# Patient Record
Sex: Female | Born: 1960 | Race: White | Hispanic: No | Marital: Single | Smoking: Current every day smoker
Health system: Southern US, Community
[De-identification: ages and names within clinical notes are randomized; demographics above are authoritative.]

## PROBLEM LIST (undated history)

## (undated) DIAGNOSIS — J449 Chronic obstructive pulmonary disease, unspecified: Secondary | ICD-10-CM

## (undated) HISTORY — PX: ECTOPIC PREGNANCY SURGERY: SHX613

## (undated) HISTORY — PX: TUBAL LIGATION: SHX77

---

## 1998-08-02 ENCOUNTER — Inpatient Hospital Stay (HOSPITAL_COMMUNITY): Admission: AD | Admit: 1998-08-02 | Discharge: 1998-08-04 | Payer: Self-pay | Admitting: Obstetrics & Gynecology

## 1999-03-02 ENCOUNTER — Emergency Department (HOSPITAL_COMMUNITY): Admission: EM | Admit: 1999-03-02 | Discharge: 1999-03-02 | Payer: Self-pay | Admitting: Emergency Medicine

## 1999-11-22 ENCOUNTER — Emergency Department (HOSPITAL_COMMUNITY): Admission: EM | Admit: 1999-11-22 | Discharge: 1999-11-22 | Payer: Self-pay | Admitting: Emergency Medicine

## 1999-12-01 ENCOUNTER — Emergency Department (HOSPITAL_COMMUNITY): Admission: EM | Admit: 1999-12-01 | Discharge: 1999-12-01 | Payer: Self-pay | Admitting: Emergency Medicine

## 1999-12-11 ENCOUNTER — Emergency Department (HOSPITAL_COMMUNITY): Admission: EM | Admit: 1999-12-11 | Discharge: 1999-12-11 | Payer: Self-pay | Admitting: Emergency Medicine

## 1999-12-13 ENCOUNTER — Encounter: Payer: Self-pay | Admitting: Emergency Medicine

## 1999-12-13 ENCOUNTER — Emergency Department (HOSPITAL_COMMUNITY): Admission: EM | Admit: 1999-12-13 | Discharge: 1999-12-13 | Payer: Self-pay | Admitting: *Deleted

## 2000-04-02 ENCOUNTER — Emergency Department (HOSPITAL_COMMUNITY): Admission: EM | Admit: 2000-04-02 | Discharge: 2000-04-02 | Payer: Self-pay | Admitting: Emergency Medicine

## 2000-04-09 ENCOUNTER — Ambulatory Visit (HOSPITAL_COMMUNITY): Admission: RE | Admit: 2000-04-09 | Discharge: 2000-04-09 | Payer: Self-pay | Admitting: Orthopedic Surgery

## 2000-04-09 ENCOUNTER — Encounter: Payer: Self-pay | Admitting: Orthopedic Surgery

## 2000-04-30 ENCOUNTER — Emergency Department (HOSPITAL_COMMUNITY): Admission: EM | Admit: 2000-04-30 | Discharge: 2000-04-30 | Payer: Self-pay | Admitting: *Deleted

## 2000-09-23 ENCOUNTER — Emergency Department (HOSPITAL_COMMUNITY): Admission: EM | Admit: 2000-09-23 | Discharge: 2000-09-23 | Payer: Self-pay | Admitting: Emergency Medicine

## 2000-10-28 ENCOUNTER — Encounter: Payer: Self-pay | Admitting: Emergency Medicine

## 2000-10-28 ENCOUNTER — Emergency Department (HOSPITAL_COMMUNITY): Admission: EM | Admit: 2000-10-28 | Discharge: 2000-10-28 | Payer: Self-pay | Admitting: Emergency Medicine

## 2000-12-26 ENCOUNTER — Emergency Department (HOSPITAL_COMMUNITY): Admission: EM | Admit: 2000-12-26 | Discharge: 2000-12-26 | Payer: Self-pay | Admitting: Emergency Medicine

## 2000-12-26 ENCOUNTER — Encounter: Payer: Self-pay | Admitting: Emergency Medicine

## 2002-03-10 ENCOUNTER — Inpatient Hospital Stay (HOSPITAL_COMMUNITY): Admission: AD | Admit: 2002-03-10 | Discharge: 2002-03-10 | Payer: Self-pay | Admitting: Obstetrics and Gynecology

## 2002-07-01 ENCOUNTER — Ambulatory Visit (HOSPITAL_BASED_OUTPATIENT_CLINIC_OR_DEPARTMENT_OTHER): Admission: RE | Admit: 2002-07-01 | Discharge: 2002-07-02 | Payer: Self-pay | Admitting: Orthopedic Surgery

## 2002-07-21 ENCOUNTER — Encounter: Admission: RE | Admit: 2002-07-21 | Discharge: 2002-08-27 | Payer: Self-pay | Admitting: Orthopedic Surgery

## 2007-04-15 ENCOUNTER — Emergency Department (HOSPITAL_COMMUNITY): Admission: EM | Admit: 2007-04-15 | Discharge: 2007-04-15 | Payer: Self-pay | Admitting: Emergency Medicine

## 2007-04-16 ENCOUNTER — Emergency Department (HOSPITAL_COMMUNITY): Admission: EM | Admit: 2007-04-16 | Discharge: 2007-04-16 | Payer: Self-pay | Admitting: Emergency Medicine

## 2009-06-18 ENCOUNTER — Encounter: Payer: Self-pay | Admitting: Emergency Medicine

## 2009-06-21 ENCOUNTER — Ambulatory Visit: Payer: Self-pay | Admitting: Psychiatry

## 2009-06-21 ENCOUNTER — Inpatient Hospital Stay (HOSPITAL_COMMUNITY): Admission: AD | Admit: 2009-06-21 | Discharge: 2009-06-23 | Payer: Self-pay | Admitting: Psychiatry

## 2009-12-25 ENCOUNTER — Emergency Department (HOSPITAL_COMMUNITY): Admission: EM | Admit: 2009-12-25 | Discharge: 2009-12-26 | Payer: Self-pay | Admitting: Emergency Medicine

## 2011-02-03 LAB — URINALYSIS, ROUTINE W REFLEX MICROSCOPIC
Bilirubin Urine: NEGATIVE
Glucose, UA: NEGATIVE mg/dL
Ketones, ur: NEGATIVE mg/dL
Leukocytes, UA: NEGATIVE
Nitrite: POSITIVE — AB
Protein, ur: 30 mg/dL — AB
Specific Gravity, Urine: 1.013 (ref 1.005–1.030)
Urobilinogen, UA: 1 mg/dL (ref 0.0–1.0)
pH: 5.5 (ref 5.0–8.0)

## 2011-02-03 LAB — DIFFERENTIAL
Basophils Relative: 0 % (ref 0–1)
Eosinophils Absolute: 0 10*3/uL (ref 0.0–0.7)
Lymphs Abs: 1 10*3/uL (ref 0.7–4.0)
Monocytes Absolute: 0.8 10*3/uL (ref 0.1–1.0)
Monocytes Relative: 13 % — ABNORMAL HIGH (ref 3–12)

## 2011-02-03 LAB — BASIC METABOLIC PANEL
CO2: 24 mEq/L (ref 19–32)
Chloride: 88 mEq/L — ABNORMAL LOW (ref 96–112)
GFR calc Af Amer: >60 mL/min (ref >60)
Potassium: 3.9 mEq/L (ref 3.5–5.1)
Sodium: 120 mEq/L — ABNORMAL LOW (ref 135–145)

## 2011-02-03 LAB — URINE MICROSCOPIC-ADD ON

## 2011-02-03 LAB — RAPID URINE DRUG SCREEN, HOSP PERFORMED
Barbiturates: NOT DETECTED
Benzodiazepines: NOT DETECTED
Cocaine: POSITIVE — AB
Opiates: NOT DETECTED

## 2011-02-03 LAB — POCT I-STAT, CHEM 8
Calcium, Ion: 1.06 mmol/L — ABNORMAL LOW (ref 1.12–1.32)
Creatinine, Ser: 0.7 mg/dL (ref 0.4–1.2)
Glucose, Bld: 89 mg/dL (ref 70–99)
HCT: 40 % (ref 36.0–46.0)
Hemoglobin: 13.6 g/dL (ref 12.0–15.0)

## 2011-02-03 LAB — CBC
HCT: 40.1 % (ref 36.0–46.0)
Hemoglobin: 14 g/dL (ref 12.0–15.0)
MCHC: 34.9 g/dL (ref 30.0–36.0)
MCV: 97.5 fL (ref 78.0–100.0)
RBC: 4.11 MIL/uL (ref 3.87–5.11)

## 2011-02-03 LAB — GLUCOSE, CAPILLARY: Glucose-Capillary: 120 mg/dL — ABNORMAL HIGH (ref 70–99)

## 2011-02-19 LAB — HEPATIC FUNCTION PANEL
ALT: 389 U/L — ABNORMAL HIGH (ref 0–35)
Bilirubin, Direct: 0.2 mg/dL (ref 0.0–0.3)
Indirect Bilirubin: 0.2 mg/dL — ABNORMAL LOW (ref 0.3–0.9)
Total Bilirubin: 0.4 mg/dL (ref 0.3–1.2)

## 2011-02-19 LAB — URINE MICROSCOPIC-ADD ON

## 2011-02-19 LAB — URINALYSIS, ROUTINE W REFLEX MICROSCOPIC
Glucose, UA: NEGATIVE mg/dL
Protein, ur: 30 mg/dL — AB
Specific Gravity, Urine: 1.005 (ref 1.005–1.030)
pH: 6.5 (ref 5.0–8.0)

## 2011-02-19 LAB — ETHANOL: Alcohol, Ethyl (B): <5 mg/dL (ref 0–10)

## 2011-02-19 LAB — COMPREHENSIVE METABOLIC PANEL
Albumin: 4.3 g/dL (ref 3.5–5.2)
Alkaline Phosphatase: 85 U/L (ref 39–117)
BUN: 6 mg/dL (ref 6–23)
Calcium: 9 mg/dL (ref 8.4–10.5)
Creatinine, Ser: 0.8 mg/dL (ref 0.4–1.2)
Glucose, Bld: 104 mg/dL — ABNORMAL HIGH (ref 70–99)
Potassium: 2.9 mEq/L — ABNORMAL LOW (ref 3.5–5.1)
Total Protein: 8.1 g/dL (ref 6.0–8.3)

## 2011-02-19 LAB — URINE CULTURE

## 2011-02-19 LAB — DIFFERENTIAL
Basophils Relative: 1 % (ref 0–1)
Lymphocytes Relative: 19 % (ref 12–46)
Lymphs Abs: 0.9 10*3/uL (ref 0.7–4.0)
Monocytes Absolute: 0.5 10*3/uL (ref 0.1–1.0)
Monocytes Relative: 10 % (ref 3–12)
Neutro Abs: 3.4 10*3/uL (ref 1.7–7.7)
Neutrophils Relative %: 69 % (ref 43–77)

## 2011-02-19 LAB — CBC
HCT: 39.8 % (ref 36.0–46.0)
Hemoglobin: 13.4 g/dL (ref 12.0–15.0)
MCHC: 33.6 g/dL (ref 30.0–36.0)
Platelets: 162 10*3/uL (ref 150–400)
RDW: 16.7 % — ABNORMAL HIGH (ref 11.5–15.5)

## 2011-02-19 LAB — RAPID URINE DRUG SCREEN, HOSP PERFORMED
Amphetamines: NOT DETECTED
Barbiturates: NOT DETECTED
Cocaine: NOT DETECTED
Opiates: NOT DETECTED

## 2011-02-19 LAB — ACETAMINOPHEN LEVEL: Acetaminophen (Tylenol), Serum: <10 ug/mL — ABNORMAL LOW (ref 10–30)

## 2011-03-28 NOTE — Discharge Summary (Signed)
NAME:  HLEE, FRINGER NO.:  [id number]   MEDICAL RECORD NO.:  [id number]          PATIENT TYPE:  IPS   LOCATION:  0406                          FACILITY:  [facility]   PHYSICIAN:  Jasmine Pang, M.D. DATE OF BIRTH:  09/17/1961   DATE OF ADMISSION:  06/21/2009  DATE OF DISCHARGE:  06/23/2009                               DISCHARGE SUMMARY   IDENTIFICATION:  This is a 50 year old female who was admitted on a  voluntary basis on June 21, 2009.   HISTORY OF PRESENT ILLNESS:  This is the first [facility] admission for this  pleasant 50 year old who presents with 2 weeks of visual hallucinations.  She states she has been seeing her relatives standing in front of her  and people walking around in the room and home.  At one point, she  believes the baby strolls over, actually speaking out loud to her.  She  reports that she had been drinking excessively with a 12-pack of beer or  more per day and was drinking that way for at least 6 months after  separating from her boyfriend of 9 years.  Subsequently, the patient  lost her apartment, her belongings, and all of her furniture.  She has  had hallucinations recently on June 20, 2009 prior to admission and was  given an injection of 20 mg of Geodon in the emergency room.  There were  no hallucinations today.  She was diagnosed with an urinary tract  infection in the emergency room.  She denies other substance abuse.  She  had no current psychiatric treatment.  She has a distant history of  opiate abuse and was previously treated with methadone.  At one point,  she has now been abstinent from opiates for more than a year.  She  denies any other substance abuse other than alcohol.  She denies a  history of prior suicide attempts.  For further admission information,  see psychiatric admission assessment.   Initially an Axis I diagnosis of psychosis disorder NOS was given, also  alcohol abuse and dependence, also a history of  polysubstance abuse.  The Axis III diagnoses were UTI, COPD and hypokalemia, recently  repleted.   PHYSICAL FINDINGS:  There were no acute physical or medical problems  noted.  Physical exam was done in the emergency room and is in the  record and was reviewed by our nurse practitioner.   DIAGNOSTIC STUDIES:  Urine drug screen was negative for all substances.  Alcohol level was less than 5.  Chemistries revealed a sodium of 135,  potassium of 2.9, chloride of 101, carbon dioxide of 21, BUN is 6,  creatinine of 0.8, and random glucose 104.  Liver enzymes revealed an  SGOT of 207, SGPT of 101, alkaline phosphatase of 85, and total  bilirubin of 2.2.  Her potassium was repleted in the emergency room.  Routine urinalysis was positive for large leukocyte esterase and WBCs of  21-58 per high-powered field.   HOSPITAL COURSE:  Upon admission, the patient was started on Ambien 10  mg p.o. q.h.s. p.r.n. insomnia.  She was also started on  Cipro 500 mg  p.o. b.i.d. x2 days only since she received this in the emergency  department.  She was also started on Librium 25 mg p.o. q.4 h. p.r.n.  withdrawal and this was subsequently discontinued the following day and  she was started on the Librium detox protocol.  She was also started on  Protonix 40 mg daily, Combivent MDI 2 puffs b.i.d. p.r.n. asthma, and  folic acid 1 mg p.o. daily.  In individual sessions, the patient was  initially somewhat disheveled with psychomotor retardation.  Speech was  soft and slow.  Mood was depressed and anxious.  Affect was consistent  with mood.  There were no suicidal or homicidal ideations.  Thought  processes were logical and goal directed.  There was no evidence of  psychosis.  The patient admitted that she drinks to much.  She drinks a  12-pack per day.  She also states she was hallucinating.  She has a lot  of stressors including a breakup and lost apartment and lost furniture.  These hallucinations appeared to  apparently occur when she was trying to  detox.  The patient stabilized very quickly.  She tolerated the detox  protocol well.  There were no side effects on her medications.  On  June 23, 2009, mental status had improved markedly from admission  status.  The patient was casually and neatly dressed.  Speech was normal  rate and flow.  Psychomotor activity was within normal limits.  The mood  was less depressed, less anxious.  Affect was consistent with mood.  There was no suicidal or homicidal ideation.  No thoughts of self-  injurious behavior.  No auditory or visual hallucinations.  No paranoia  or delusions.  Thoughts were logical and goal directed.  Thought  content, no predominant theme.  Cognitive was grossly intact.  Insight  good.  Judgment good.  Impulse control good.  The patient wanted to go  home and was felt to be safe for discharge today.   DISCHARGE DIAGNOSES:  Axis I:  Psychotic disorder, not otherwise  specified (rule out features of delirium from alcohol withdrawal);  alcohol dependence and history of polysubstance abuse.  Axis II:  None.  Axis III:  Urinary tract infection and chronic obstructive pulmonary  disease.  Axis IV:  Severe (issues with homelessness, breakup of relationship,  loss of furniture, burden of psychiatric and chemical dependence  illness, burden of medical problems).  Axis V:  Global assessment of functioning was 50 on discharge.  GAF was  46 upon admission.  GAF highest past year was 65.   DISCHARGE PLANS:  There were no specific activity level or dietary  restrictions.   POSTHOSPITAL CARE PLANS:  The patient will go to [employer] in  [place].  She is to get a sponsor.  She did not want any therapy at  this time.   DISCHARGE MEDICATIONS:  Cipro was finished.  She is to seek her doctor  if her bladder infection continues or other symptoms develop.  Of note,  she was given a pneumonia vaccine IM on June 22, 2009.      Jasmine Pang, M.D.  Electronically Signed     BHS/MEDQ  D:  06/29/2009  T:  06/30/2009  Job:  [id number]

## 2011-03-28 NOTE — H&P (Signed)
NAMENAEEMA, PATLAN NO.:  [id number]   MEDICAL RECORD NO.:  [id number]          PATIENT TYPE:  IPS   LOCATION:  0406                          [facility]ACILITY[facility]  [facility]   PHYSICIAN:  Anselm Jungling, MD  DATE OF BIRTH:  1961/01/25   DATE OF ADMISSION:  06/21/2009  DATE OF DISCHARGE:                       PSYCHIATRIC ADMISSION ASSESSMENT   IDENTIFYING INFORMATION:  A 50 year old female.  This is a voluntary  admission.   HISTORY OF THE PRESENT ILLNESS:  First [facility] admission for this pleasant  50 year old who presents with 2 weeks of visual hallucinations, had been  seeing her relatives standing in front of her, people walking around in  the room at home.  At one point, believed that a baby stroller was  actually speaking out loud to her then had almost an entire day where  she thought she was attending a party when in fact it was all a  hallucination.  She reports that she has been drinking excessively,  about a 12-pack of beer or more per day and has been drinking that way  for at least 6 months after separating from her boyfriend of 9 years,  subsequently losing her apartment, her belongings, and all of her  furniture.  She has had hallucinations as recently as June 20, 2009,  prior to admission and was given an injection of 20 mg of Geodon.  No  hallucinations today.  Was diagnosed with a urinary tract infection in  the emergency room.  She denies other substance abuse.   PAST PSYCHIATRIC HISTORY:  No current treatment.  She has a distant  history of opiate abuse and was previously treated with methadone at one  point.  Has now been abstinent from opiates for more than a year.  Denies any other substance abuse other than alcohol.  She denies a  history of prior suicide attempts.   SOCIAL HISTORY:  Single female currently living with her brother here in  [place], plans to return there.  Denies legal problems.   FAMILY HISTORY:  Brother and mother both with  significant alcohol  issues.  Multiple aunts and uncles with history of alcohol and other  substance abuse.   MEDICAL HISTORY:  No regular primary care [facility] J[facility].   MEDICAL PROBLEMS:  1. COPD.  2. Diagnosed with UTI in the emergency room.   PAST MEDICAL HISTORY:   CURRENT MEDICATIONS:  None.   PREVIOUS MEDICATIONS:  1. Spiriva.  2. Combivent.  3. Prilosec for GERD.   DRUG ALLERGIES:  NONE.   PHYSICAL EXAM:  Was done in the emergency room as noted in the record.  This is a slim-built 50 year old female, fully oriented today.  Appears  in no distress.  VITAL SIGNS:  Temp 98.4.  Pulse 91.  Respirations 24.  Blood pressure  189/110 on her initial presentation in the emergency room on June 18, 2009.   DIAGNOSTIC STUDIES:  Were done in the emergency room.  Her urine drug  screen was negative for all substances.  Alcohol level less than 5.  Chemistry, sodium 135, potassium 2.9, chloride 101, carbon dioxide 21,  BUN 6, creatinine  0.8, and random glucose 104.  Liver enzymes, SGOT 207,  SGPT 101, alkaline phosphatase 85, and total bilirubin 2.2.  her  potassium was repleted in the emergency room.  Routine urinalysis  revealed positive for large leukocyte esterase and WBCs of 21 to 50 per  high-powered field.   MENTAL STATUS EXAM:  Fully alert female, completely oriented today,  pleasant, good eye contact.  Has been in contact with family.  Interacting appropriately on the unit.  Out in the day room.  Is  attending groups.  Speech is normal.  Gives a coherent history.  Requesting detox from alcohol and ongoing treatment.  Mood is neutral  today.  Thought process logical and coherent.  No evidence of any  delirium or confusion.  She is denying any further hallucinations since  yesterday.  Does feel that she is slightly shaky inside, nauseous, feels  that she is continuing to have some withdrawal symptoms.  Asking for  help with referrals with ongoing treatment.  Cognitively, memory  is  intact.  Insight is good.  Impulse control and judgment within normal  limits.   AXIS I:  1. Psychosis NOS.  2. Alcohol abuse and dependence.  3. History of polysubstance abuse.  AXIS II:  Deferred.  AXIS III:  1. UTI.  2. COPD.  3. Hypokalemia, repleted.  AXIS IV:  Severe issues with homelessness.  AXIS V:  Current 46, past year not known.   PLAN:  Admit her to our dual diagnosis unit.  She has been cooperative  with staff.  No further symptoms of psychosis.  We are going to restart  her Combivent inhalers.  We have started her on a Librium protocol to  start on day 2 today.  She will continue to receive p.r.n. medications.  Will receive IM thiamine and p.r.n. medications to control symptoms.  Also, has had some problems with GERD and we will restart her Protonix  at 40 mg daily.  Additionally, she incidentally has some poison ivy  dermatitis on her chest for which we will prescribe 1% hydrocortisone  cream.  She received a dose of IM Rocephin in the emergency room for her  UTI and she is on Cipro 500 mg b.i.d. for a total of 3 days which we  will finish tomorrow, June 23, 2009.  We will check a TSH, a B12  level, RBC, folate.  She is receiving 100 mg of thiamine daily and folic  acid 1 mg daily.     Margaret A. Lorin Picket, N.P.      Anselm Jungling, MD  Electronically Signed   MAS/MEDQ  D:  06/22/2009  T:  06/22/2009  Job:  [id number]

## 2011-03-31 NOTE — Op Note (Signed)
NAME:  Candice Cooper, Candice Cooper NO.:  [id number]   MEDICAL RECORD NO.:  [id number]                   PATIENT TYPE:  AMB   LOCATION:  DSC                                  FACILITY:  [facility]   PHYSICIAN:  Burnard Bunting, M.D.                 DATE OF BIRTH:  11/18/1960   DATE OF PROCEDURE:  07/01/2002  DATE OF DISCHARGE:  07/02/2002                                 OPERATIVE REPORT   PREOPERATIVE DIAGNOSIS:  Left knee anterior cruciate ligament tear and  medial meniscal tear.   POSTOPERATIVE DIAGNOSES:  Left knee anterior cruciate ligament tear and  medial and lateral meniscal tears.   PROCEDURE:  1. Left knee anterior cruciate ligament reconstruction with allograft.  2. Partial medial and lateral meniscectomies.  3. Removal of loose bodies.   SURGEON:  Burnard Bunting, M.D.   ANESTHESIA:  General endotracheal.   ESTIMATED BLOOD LOSS:  10 cc.   DRAINS:  None.   TOURNIQUET TIME:  1 hour 37 minutes at 300 mmHg.   OPERATIVE FINDINGS:  1. Examination under anesthesia:  Range of motion 0-130.  She had stability     to varus and valgus stress at 0 and 30 degrees.  No posterolateral     rotatory instability.  PCL intact.  She had 5 mm of anterior drawer with     soft end point and positive pivot shift.  2. Diagnostic and operative arthroscopy:  (1) Intact patellofemoral     compartment.  (2) No loose bodies in medial or lateral gutter.  (3) There     was a loose body within the intercondylar notch region in accordance with     the MRI scan.  (4) Torn ACL, intact PCL.  (5) Torn medial meniscus with     free flap rotated in front of the joint and grade 1 chondromalacic     changes on the medial femoral condyle and tibial plateau.  (6) Torn     lateral meniscus involving 50% of the anterior-posterior width, slightly     lateral to the popliteus tendon.  There were also grade 1 chondral     changes on the tibial plateau in the lateral compartment.   DESCRIPTION OF  PROCEDURE:  The patient was brought to the operating room,  where general endotracheal anesthesia was induced.  Preoperative IV  antibiotics were administered.  The left leg was prepped with Duraprep  solution and draped in a sterile manner.  Collier Flowers was used to cover the  operative field.  On the back table the allograft was prepared.  Using the  oscillating saw, 10 mm bone plugs were fashioned and shaped into a bullet  for easy passage.  Ethibond #5 was passed through the nose of what would be  the femoral bone plug.  Two Fibrewires were placed through what would be the  tibial bone plug.  After preparing the allograft,  the leg was elevated and  exsanguinated with the Esmarch wrap and the tourniquet was inflated.  The  anterior inferior lateral portal was first established after localization  with the spinal needle.  Anterior inferior medial portal was then  established under direct visualization with a spinal needle.  Diagnostic  arthroscopy was performed.  The patellofemoral component was intact.  The  lateral compartment had a meniscal tear as well as grade 1 chondral changes  on the medial femoral condyle tibial plateau.  The meniscal tear was  resected back to a stable rim using a combination of basket punch and  shavers.  The loose body was removed from the intercondylar notch.  The ACL  stump was debrided and the over-the-top position was identified in the  posterior aspect of the lateral femoral condyle.  The lateral component was  inspected and the meniscal tear was noted here as well, which extended  lateral to the popliteus tendon.  This was also resected back to a stable  rim using basket punch and shavers.  At this time the ACL stump was shaved.  An ACL guide was set at 50 degrees.  A guide pin was placed through the  central to posterior aspect of the native ACL stump, right through the  middle artery.  A 10 mm tunnel was then reamed with an acorn reamer.  The 2  mm offset  guide was then placed into the back of the knee.  The 10 mm acorn  reamer was then used to drill a 27 mm tunnel.  Beath pin was placed, and the  graft was then delivered into the joint.  A nice horizontal appearance of  the graft was noted.  The graft was then secured into the femoral side with  an 8 x 23 mm interference screw.  Anesthesia check was positive for secure  fixation.  The graft was taken through a range of motion.  Isometry was  found to be very good.  There was no graft impingement in the intercondylar  notch region.  Notchplasty had been performed prior to graft passage.  The  graft was then secured on the tibial side in 15 degrees of flexion, in  maximum pull using another 8 x 23 mm interference screw.  The knee was taken  through a range of motion, was found to have about a 1.5 mm locking with  firm end point, negative pivot shift.  At this time the tourniquet was  released, the knee was irrigated.  Portals were closed using a 2-0 Vicryl  and 3-0 Prolene.  The small incision for the tibial drill guide was then  closed using interrupted 2-0 Vicryl suture followed by 3-0 Prolene suture.  Fifteen to twenty cubic centimeters of solution of Marcaine and morphine was  placed into the knee.  The knee was then wrapped in a bulky dressing.  The  tourniquet was released after 1 hour 37 minutes.  The knee immobilizer was  placed.  The foot was perfused after tourniquet released.  The patient  tolerated the procedure well without immediate complications.                                               Burnard Bunting, M.D.    GSD/MEDQ  D:  07/02/2002  T:  07/03/2002  Job:  [id number]

## 2011-08-31 ENCOUNTER — Institutional Professional Consult (permissible substitution): Payer: Self-pay | Admitting: Internal Medicine

## 2011-08-31 LAB — CBC
HCT: 31.6 — ABNORMAL LOW
Hemoglobin: 10.1 — ABNORMAL LOW
MCV: 74.4 — ABNORMAL LOW
RBC: 4.24
WBC: 4.2

## 2011-08-31 LAB — URINALYSIS, ROUTINE W REFLEX MICROSCOPIC
Bilirubin Urine: NEGATIVE
Glucose, UA: NEGATIVE
Hgb urine dipstick: NEGATIVE
Protein, ur: NEGATIVE
Specific Gravity, Urine: 1.006

## 2011-08-31 LAB — I-STAT 8, (EC8 V) (CONVERTED LAB)
Acid-base deficit: 1
Bicarbonate: 25.3 — ABNORMAL HIGH
Hemoglobin: 12.2
Potassium: 4
TCO2: 27

## 2011-08-31 LAB — URINE MICROSCOPIC-ADD ON

## 2011-08-31 LAB — RAPID URINE DRUG SCREEN, HOSP PERFORMED
Barbiturates: NOT DETECTED
Opiates: POSITIVE — AB

## 2016-03-22 ENCOUNTER — Encounter (HOSPITAL_COMMUNITY): Payer: Self-pay | Admitting: Emergency Medicine

## 2016-03-22 ENCOUNTER — Emergency Department (HOSPITAL_COMMUNITY)
Admission: EM | Admit: 2016-03-22 | Discharge: 2016-03-22 | Disposition: A | Discharge status: Home / Self Care | Payer: Medicaid Other | Attending: Emergency Medicine | Admitting: Emergency Medicine

## 2016-03-22 DIAGNOSIS — F17200 Nicotine dependence, unspecified, uncomplicated: Secondary | ICD-10-CM | POA: Diagnosis not present

## 2016-03-22 DIAGNOSIS — R1030 Lower abdominal pain, unspecified: Secondary | ICD-10-CM | POA: Diagnosis present

## 2016-03-22 DIAGNOSIS — Z791 Long term (current) use of non-steroidal anti-inflammatories (NSAID): Secondary | ICD-10-CM | POA: Insufficient documentation

## 2016-03-22 DIAGNOSIS — N390 Urinary tract infection, site not specified: Secondary | ICD-10-CM | POA: Diagnosis not present

## 2016-03-22 DIAGNOSIS — J449 Chronic obstructive pulmonary disease, unspecified: Secondary | ICD-10-CM | POA: Diagnosis not present

## 2016-03-22 HISTORY — DX: Chronic obstructive pulmonary disease, unspecified: J44.9

## 2016-03-22 LAB — URINE MICROSCOPIC-ADD ON

## 2016-03-22 LAB — URINALYSIS, ROUTINE W REFLEX MICROSCOPIC
Bilirubin Urine: NEGATIVE
Glucose, UA: NEGATIVE mg/dL
Ketones, ur: NEGATIVE mg/dL
Nitrite: NEGATIVE
Protein, ur: NEGATIVE mg/dL
SPECIFIC GRAVITY, URINE: 1.006 (ref 1.005–1.030)
pH: 6.5 (ref 5.0–8.0)

## 2016-03-22 LAB — CBC
HCT: 45.6 % (ref 36.0–46.0)
HEMOGLOBIN: 15.7 g/dL — AB (ref 12.0–15.0)
MCH: 33.9 pg (ref 26.0–34.0)
MCHC: 34.4 g/dL (ref 30.0–36.0)
MCV: 98.5 fL (ref 78.0–100.0)
Platelets: 207 10*3/uL (ref 150–400)
RBC: 4.63 MIL/uL (ref 3.87–5.11)
RDW: 14.2 % (ref 11.5–15.5)
WBC: 5.7 10*3/uL (ref 4.0–10.5)

## 2016-03-22 LAB — COMPREHENSIVE METABOLIC PANEL
ALK PHOS: 49 U/L (ref 38–126)
ALT: 19 U/L (ref 14–54)
ANION GAP: 12 (ref 5–15)
AST: 34 U/L (ref 15–41)
Albumin: 4.2 g/dL (ref 3.5–5.0)
BUN: <5 mg/dL — ABNORMAL LOW (ref 6–20)
CALCIUM: 9.1 mg/dL (ref 8.9–10.3)
CHLORIDE: 89 mmol/L — AB (ref 101–111)
CO2: 26 mmol/L (ref 22–32)
Creatinine, Ser: 0.7 mg/dL (ref 0.44–1.00)
GFR calc non Af Amer: >60 mL/min (ref >60)
Glucose, Bld: 92 mg/dL (ref 65–99)
Potassium: 3.7 mmol/L (ref 3.5–5.1)
SODIUM: 127 mmol/L — AB (ref 135–145)
Total Bilirubin: 0.9 mg/dL (ref 0.3–1.2)
Total Protein: 7.6 g/dL (ref 6.5–8.1)

## 2016-03-22 LAB — LIPASE, BLOOD: LIPASE: 48 U/L (ref 11–51)

## 2016-03-22 MED ORDER — CEPHALEXIN 250 MG PO CAPS: 250.0000 mg | ORAL_CAPSULE | Freq: Four times a day (QID) | ORAL | Status: DC

## 2016-03-22 MED ORDER — CEPHALEXIN 250 MG PO CAPS
250.0000 mg | ORAL_CAPSULE | Freq: Once | ORAL | Status: AC
  Administered 2016-03-22: 250 mg via ORAL
  Filled 2016-03-22: qty 1

## 2016-03-22 MED ORDER — HYDROCODONE-ACETAMINOPHEN 5-325 MG PO TABS
2.0000 | ORAL_TABLET | Freq: Once | ORAL | Status: AC
  Administered 2016-03-22: 2 via ORAL
  Filled 2016-03-22: qty 2

## 2016-03-22 NOTE — Discharge Instructions (Signed)
You have been diagnosed with a UTI.  Please take your medications as directed.  Please return to the ER for fever, worsening pain, or vomiting.  You sodium was slightly low today.  Please drink more water and have this rechecked by your primary care doctor in 1 week.  Urinary Tract Infection Urinary tract infections (UTIs) can develop anywhere along your urinary tract. Your urinary tract is your body's drainage system for removing wastes and extra water. Your urinary tract includes two kidneys, two ureters, a bladder, and a urethra. Your kidneys are a pair of bean-shaped organs. Each kidney is about the size of your fist. They are located below your ribs, one on each side of your spine. CAUSES Infections are caused by microbes, which are microscopic organisms, including fungi, viruses, and bacteria. These organisms are so small that they can only be seen through a microscope. Bacteria are the microbes that most commonly cause UTIs. SYMPTOMS  Symptoms of UTIs may vary by age and gender of the patient and by the location of the infection. Symptoms in young women typically include a frequent and intense urge to urinate and a painful, burning feeling in the bladder or urethra during urination. Older women and men are more likely to be tired, shaky, and weak and have muscle aches and abdominal pain. A fever may mean the infection is in your kidneys. Other symptoms of a kidney infection include pain in your back or sides below the ribs, nausea, and vomiting. DIAGNOSIS To diagnose a UTI, your caregiver will ask you about your symptoms. Your caregiver will also ask you to provide a urine sample. The urine sample will be tested for bacteria and white blood cells. White blood cells are made by your body to help fight infection. TREATMENT  Typically, UTIs can be treated with medication. Because most UTIs are caused by a bacterial infection, they usually can be treated with the use of antibiotics. The choice of  antibiotic and length of treatment depend on your symptoms and the type of bacteria causing your infection. HOME CARE INSTRUCTIONS  If you were prescribed antibiotics, take them exactly as your caregiver instructs you. Finish the medication even if you feel better after you have only taken some of the medication.  Drink enough water and fluids to keep your urine clear or pale yellow.  Avoid caffeine, tea, and carbonated beverages. They tend to irritate your bladder.  Empty your bladder often. Avoid holding urine for long periods of time.  Empty your bladder before and after sexual intercourse.  After a bowel movement, women should cleanse from front to back. Use each tissue only once. SEEK MEDICAL CARE IF:   You have back pain.  You develop a fever.  Your symptoms do not begin to resolve within 3 days. SEEK IMMEDIATE MEDICAL CARE IF:   You have severe back pain or lower abdominal pain.  You develop chills.  You have nausea or vomiting.  You have continued burning or discomfort with urination. MAKE SURE YOU:   Understand these instructions.  Will watch your condition.  Will get help right away if you are not doing well or get worse.   This information is not intended to replace advice given to you by your health care provider. Make sure you discuss any questions you have with your health care provider.   Document Released: 08/09/2005 Document Revised: 07/21/2015 Document Reviewed: 12/08/2011 Elsevier Interactive Patient Education [employer]2016 [employer]

## 2016-03-22 NOTE — ED Provider Notes (Signed)
CSN: [id number]     Arrival date & time 03/22/16  1856 History   First MD Initiated Contact with Patient 03/22/16 1957     Chief Complaint  Patient presents with  . Abdominal Pain     (Consider location/radiation/quality/duration/timing/severity/associated sxs/prior Treatment) HPI Comments: Patient presents to the ED with a chief complaint of lower abdominal pain, frequency, urgency, and dysuria.  She states that her symptoms feel like a UTI.  She states that she has been treating the symptoms with AZO and ibuprofen.  Onset was about a week ago.  She denies any associated fever, chills, nausea or vomiting.  She denies any other associated symptoms.  She states that she is allergic to something IV, but doesn't know what.  She states that she has done well with penicillin and amoxicillin in the past.  The history is provided by the patient. No language interpreter was used.    Past Medical History  Diagnosis Date  . COPD (chronic obstructive pulmonary disease) [facility])    Past Surgical History  Procedure Laterality Date  . Tubal ligation    . Ectopic pregnancy surgery    . Cesarean section     Family History  Problem Relation Age of Onset  . Diabetes Brother    Social History  Substance Use Topics  . Smoking status: Current Every Day Smoker  . Smokeless tobacco: None  . Alcohol Use: Yes   OB History    No data available     Review of Systems  Constitutional: Negative for fever and chills.  Respiratory: Negative for shortness of breath.   Cardiovascular: Negative for chest pain.  Gastrointestinal: Positive for abdominal pain. Negative for nausea, vomiting, diarrhea and constipation.  Genitourinary: Positive for dysuria, urgency and frequency.  All other systems reviewed and are negative.     Allergies  Review of patient's allergies indicates no known allergies.  Home Medications   Prior to Admission medications   Medication Sig Start Date End Date Taking? Authorizing  Provider  ibuprofen (ADVIL,MOTRIN) 200 MG tablet Take 400-600 mg by mouth every 4 (four) hours as needed.   Yes Historical Provider, MD  naproxen sodium (ANAPROX) 220 MG tablet Take 440 mg by mouth 2 (two) times daily as needed (pain).   Yes Historical Provider, MD   BP 125/87 mmHg  Pulse 81  Temp(Src) 97.9 F (36.6 C) (Oral)  Resp 16  Ht  (1.676 m)  Wt 54.432 kg  BMI 19.38 kg/m2  SpO2 98% Physical Exam  Constitutional: She is oriented to person, place, and time. She appears well-developed and well-nourished.  HENT:  Head: Normocephalic and atraumatic.  Eyes: Conjunctivae and EOM are normal. Pupils are equal, round, and reactive to light.  Neck: Normal range of motion. Neck supple.  Cardiovascular: Normal rate and regular rhythm.  Exam reveals no gallop and no friction rub.   No murmur heard. Pulmonary/Chest: Effort normal and breath sounds normal. No respiratory distress. She has no wheezes. She has no rales. She exhibits no tenderness.  Abdominal: Soft. Bowel sounds are normal. She exhibits no distension and no mass. There is no tenderness. There is no rebound and no guarding.  No focal abdominal tenderness, no RLQ tenderness or pain at McBurney's point, no RUQ tenderness or Murphy's sign, no left-sided abdominal tenderness, no fluid wave, or signs of peritonitis   Musculoskeletal: Normal range of motion. She exhibits no edema or tenderness.  Neurological: She is alert and oriented to person, place, and time.  Skin: Skin  is warm and dry.  Psychiatric: She has a normal mood and affect. Her behavior is normal. Judgment and thought content normal.  Nursing note and vitals reviewed.   ED Course  Procedures (including critical care time) Results for orders placed or performed during the hospital encounter of 03/22/16  Lipase, blood  Result Value Ref Range   Lipase 48 11 - 51 U/L  Comprehensive metabolic panel  Result Value Ref Range   Sodium 127 (L) 135 - 145 mmol/L    Potassium 3.7 3.5 - 5.1 mmol/L   Chloride 89 (L) 101 - 111 mmol/L   CO2 26 22 - 32 mmol/L   Glucose, Bld 92 65 - 99 mg/dL   BUN <5 (L) 6 - 20 mg/dL   Creatinine, Ser 3.240.70 0.44 - 1.00 mg/dL   Calcium 9.1 8.9 - 40.110.3 mg/dL   Total Protein 7.6 6.5 - 8.1 g/dL   Albumin 4.2 3.5 - 5.0 g/dL   AST 34 15 - 41 U/L   ALT 19 14 - 54 U/L   Alkaline Phosphatase 49 38 - 126 U/L   Total Bilirubin 0.9 0.3 - 1.2 mg/dL   GFR calc non Af Amer >60 >60 mL/min   GFR calc Af Amer >60 >60 mL/min   Anion gap 12 5 - 15  CBC  Result Value Ref Range   WBC 5.7 4.0 - 10.5 K/uL   RBC 4.63 3.87 - 5.11 MIL/uL   Hemoglobin 15.7 (H) 12.0 - 15.0 g/dL   HCT 02.745.6 25.336.0 - 66.446.0 %   MCV 98.5 78.0 - 100.0 fL   MCH 33.9 26.0 - 34.0 pg   MCHC 34.4 30.0 - 36.0 g/dL   RDW 40.314.2 47.411.5 - 25.915.5 %   Platelets 207 150 - 400 K/uL  Urinalysis, Routine w reflex microscopic  Result Value Ref Range   Color, Urine YELLOW YELLOW   APPearance CLOUDY (A) CLEAR   Specific Gravity, Urine 1.006 1.005 - 1.030   pH 6.5 5.0 - 8.0   Glucose, UA NEGATIVE NEGATIVE mg/dL   Hgb urine dipstick SMALL (A) NEGATIVE   Bilirubin Urine NEGATIVE NEGATIVE   Ketones, ur NEGATIVE NEGATIVE mg/dL   Protein, ur NEGATIVE NEGATIVE mg/dL   Nitrite NEGATIVE NEGATIVE   Leukocytes, UA MODERATE (A) NEGATIVE  Urine microscopic-add on  Result Value Ref Range   Squamous Epithelial / LPF 0-5 (A) NONE SEEN   WBC, UA TOO NUMEROUS TO COUNT 0 - 5 WBC/hpf   RBC / HPF 0-5 0 - 5 RBC/hpf   Bacteria, UA MANY (A) NONE SEEN   No results found.  I have personally reviewed and evaluated these lab results as part of my medical decision-making.   MDM   Final diagnoses:  UTI (lower urinary tract infection)    Patient with symptoms consistent with UTI. Abdomen is soft and non-tender.  Patient is non-toxic appearing. Will treat with keflex.  She has take penicillin and amoxicillin with no problems in the past.  Will observe for 30 minutes prior to discharge to ensure no  allergy.  Sodium slightly low and hgb is up, suspect dehydration.  Encouraged to drink more water.  Tolerating POs. Urine sent for culture.      Roxy HorsemanRobert Druanne Bosques, PA-C 03/22/16 2042  Bethann BerkshireJoseph Zammit, MD 03/23/16 [phone]

## 2016-03-22 NOTE — ED Notes (Signed)
Pt states she is having lower abd pain with a lot of pressure  Pt states she feels like she has to go to the restroom and cannot or she goes small amount at a time  Pt states she has been taking Azo but it is not helping  Pt states she thinks she has a UTI

## 2016-03-25 LAB — URINE CULTURE

## 2016-03-26 ENCOUNTER — Telehealth (HOSPITAL_BASED_OUTPATIENT_CLINIC_OR_DEPARTMENT_OTHER): Payer: Self-pay

## 2016-03-26 NOTE — Telephone Encounter (Signed)
Post ED Visit - Positive Culture Follow-up  Culture report reviewed by antimicrobial stewardship pharmacist:  [x]  Candice Cooper, Pharm.D. []  Candice Cooper, Pharm.D., BCPS []  Candice Cooper, Pharm.D. []  Candice Cooper, Pharm.D., BCPS []  Candice Cooper, [place]arm[place]D., BCPS, AAHIVP []  Candice Cooper, Pharm.D., BCPS, AAHIVP []  Candice Cooper, Pharm.D. []  Candice Cooper, [address]Pharm[address]  Positive urine culture Treated with Cephalexin, organism sensitive to the same and no further patient follow-up is required at this time.  Candice Cooper, Candice Cooper 03/26/2016, 1:01 PM

## 2020-09-13 ENCOUNTER — Emergency Department (HOSPITAL_COMMUNITY): Payer: Medicaid Other

## 2020-09-13 ENCOUNTER — Other Ambulatory Visit: Payer: Self-pay

## 2020-09-13 ENCOUNTER — Emergency Department (HOSPITAL_COMMUNITY)
Admission: EM | Admit: 2020-09-13 | Discharge: 2020-09-13 | Disposition: A | Discharge status: Home / Self Care | Payer: Medicaid Other | Attending: Emergency Medicine | Admitting: Emergency Medicine

## 2020-09-13 ENCOUNTER — Encounter (HOSPITAL_COMMUNITY)
Admission: EM | Disposition: A | Discharge status: Home / Self Care | Payer: Self-pay | Source: Home / Self Care | Attending: Emergency Medicine

## 2020-09-13 ENCOUNTER — Encounter (HOSPITAL_COMMUNITY): Payer: Self-pay

## 2020-09-13 DIAGNOSIS — J449 Chronic obstructive pulmonary disease, unspecified: Secondary | ICD-10-CM | POA: Insufficient documentation

## 2020-09-13 DIAGNOSIS — X58XXXA Exposure to other specified factors, initial encounter: Secondary | ICD-10-CM | POA: Insufficient documentation

## 2020-09-13 DIAGNOSIS — Z20822 Contact with and (suspected) exposure to covid-19: Secondary | ICD-10-CM | POA: Insufficient documentation

## 2020-09-13 DIAGNOSIS — K222 Esophageal obstruction: Secondary | ICD-10-CM

## 2020-09-13 DIAGNOSIS — T18128A Food in esophagus causing other injury, initial encounter: Secondary | ICD-10-CM

## 2020-09-13 DIAGNOSIS — F17200 Nicotine dependence, unspecified, uncomplicated: Secondary | ICD-10-CM | POA: Insufficient documentation

## 2020-09-13 DIAGNOSIS — W44F3XA Food entering into or through a natural orifice, initial encounter: Secondary | ICD-10-CM | POA: Insufficient documentation

## 2020-09-13 DIAGNOSIS — Z79899 Other long term (current) drug therapy: Secondary | ICD-10-CM | POA: Insufficient documentation

## 2020-09-13 HISTORY — PX: ESOPHAGOGASTRODUODENOSCOPY: SHX5428

## 2020-09-13 HISTORY — PX: FOREIGN BODY REMOVAL: SHX962

## 2020-09-13 LAB — RESP PANEL BY RT PCR (RSV, FLU A&B, COVID)
Influenza A by PCR: NEGATIVE
Influenza B by PCR: NEGATIVE
Respiratory Syncytial Virus by PCR: NEGATIVE
SARS Coronavirus 2 by RT PCR: NEGATIVE

## 2020-09-13 SURGERY — EGD (ESOPHAGOGASTRODUODENOSCOPY)
Anesthesia: Moderate Sedation

## 2020-09-13 MED ORDER — PROPOFOL 500 MG/50ML IV EMUL
INTRAVENOUS | Status: AC
  Filled 2020-09-13: qty 50

## 2020-09-13 MED ORDER — MIDAZOLAM HCL (PF) 10 MG/2ML IJ SOLN
INTRAMUSCULAR | Status: DC | PRN
  Administered 2020-09-13: 2 mg via INTRAVENOUS
  Administered 2020-09-13 (×4): 1 mg via INTRAVENOUS

## 2020-09-13 MED ORDER — KETAMINE HCL 10 MG/ML IJ SOLN
INTRAMUSCULAR | Status: AC | PRN
  Administered 2020-09-13: 50 mg via INTRAVENOUS

## 2020-09-13 MED ORDER — ONDANSETRON HCL 4 MG/2ML IJ SOLN
4.0000 mg | Freq: Once | INTRAMUSCULAR | Status: AC
  Administered 2020-09-13: 4 mg via INTRAVENOUS
  Filled 2020-09-13: qty 2

## 2020-09-13 MED ORDER — MORPHINE SULFATE (PF) 2 MG/ML IV SOLN
2.0000 mg | Freq: Once | INTRAVENOUS | Status: AC
  Administered 2020-09-13: 2 mg via INTRAVENOUS
  Filled 2020-09-13: qty 1

## 2020-09-13 MED ORDER — PROPOFOL 10 MG/ML IV BOLUS
INTRAVENOUS | Status: AC | PRN
  Administered 2020-09-13: 50 mg via INTRAVENOUS
  Administered 2020-09-13: 20 mg via INTRAVENOUS

## 2020-09-13 MED ORDER — GLUCAGON HCL RDNA (DIAGNOSTIC) 1 MG IJ SOLR
1.0000 mg | Freq: Once | INTRAMUSCULAR | Status: AC
  Administered 2020-09-13: 1 mg via INTRAVENOUS
  Filled 2020-09-13: qty 1

## 2020-09-13 MED ORDER — MAGIC MOUTHWASH W/LIDOCAINE: 5.0000 mL | Freq: Three times a day (TID) | ORAL | 0 refills | Status: AC

## 2020-09-13 MED ORDER — FENTANYL CITRATE (PF) 100 MCG/2ML IJ SOLN
INTRAMUSCULAR | Status: AC
  Filled 2020-09-13: qty 4

## 2020-09-13 MED ORDER — DIPHENHYDRAMINE HCL 50 MG/ML IJ SOLN
INTRAMUSCULAR | Status: DC | PRN
  Administered 2020-09-13: 25 mg via INTRAVENOUS

## 2020-09-13 MED ORDER — KETAMINE HCL 50 MG/5ML IJ SOSY: 50.0000 mg | PREFILLED_SYRINGE | Freq: Once | INTRAMUSCULAR | Status: DC

## 2020-09-13 MED ORDER — SODIUM CHLORIDE 0.9 % IV SOLN: INTRAVENOUS | Status: DC

## 2020-09-13 MED ORDER — MIDAZOLAM HCL (PF) 5 MG/ML IJ SOLN
INTRAMUSCULAR | Status: AC
  Filled 2020-09-13: qty 2

## 2020-09-13 MED ORDER — MAGIC MOUTHWASH W/LIDOCAINE: 5.0000 mL | Freq: Three times a day (TID) | ORAL | 0 refills | Status: DC

## 2020-09-13 MED ORDER — OMEPRAZOLE 20 MG PO CPDR: 20.0000 mg | DELAYED_RELEASE_CAPSULE | Freq: Every day | ORAL | 0 refills | Status: DC

## 2020-09-13 MED ORDER — ONDANSETRON HCL 4 MG PO TABS: 4.0000 mg | ORAL_TABLET | Freq: Four times a day (QID) | ORAL | 0 refills | Status: AC

## 2020-09-13 MED ORDER — PROPOFOL 10 MG/ML IV BOLUS: 200.0000 mg | Freq: Once | INTRAVENOUS | Status: DC

## 2020-09-13 MED ORDER — KETAMINE HCL 50 MG/5ML IJ SOSY
PREFILLED_SYRINGE | INTRAMUSCULAR | Status: AC
  Filled 2020-09-13: qty 5

## 2020-09-13 MED ORDER — DIPHENHYDRAMINE HCL 50 MG/ML IJ SOLN
INTRAMUSCULAR | Status: AC
  Filled 2020-09-13: qty 2

## 2020-09-13 MED ORDER — FENTANYL CITRATE (PF) 100 MCG/2ML IJ SOLN
INTRAMUSCULAR | Status: DC | PRN
  Administered 2020-09-13 (×3): 25 ug via INTRAVENOUS
  Administered 2020-09-13: 50 ug via INTRAVENOUS

## 2020-09-13 NOTE — ED Provider Notes (Addendum)
[facility]-EMERGENCY DEPT Provider Note   CSN: [id number] Arrival date & time: 09/13/20  1451     History Chief Complaint  Patient presents with   Choking    Candice Cooper is a 59 y.o. female.  HPI   Patient with no significant medical history presents to the emergency department with chief complaint of choking.  Patient states around 1:30 PM she was eating sausage and she felt like it got stuck in her throat.  Since then she has been unable to hold down food or water.  She continues to gag and is unable to get the sausage back up.  She states she has had this happen to her in the past but has never had to come to the hospital for this.  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  She denies shortness of breath, throat pain, nausea, vomiting, fevers or chills.  She endorses that she is unable to swallow her own saliva as it keeps coming back up.  She denies headache, fever, chills, shortness breath, chest pain, vomiting, nausea vomiting diarrhea.  Past Medical History:  Diagnosis Date   COPD (chronic obstructive pulmonary disease) (HCC)     Patient Active Problem List   Diagnosis Date Noted   Esophageal obstruction due to food impaction    Food impaction of esophagus     Past Surgical History:  Procedure Laterality Date   CESAREAN SECTION     ECTOPIC PREGNANCY SURGERY     TUBAL LIGATION       OB History   No obstetric history on file.     Family History  Problem Relation Age of Onset   Diabetes Brother     Social History   Tobacco Use   Smoking status: Current Every Day Smoker  Substance Use Topics   Alcohol use: Yes   Drug use: Yes    Types: Marijuana    Comment: last use today     Home Medications Prior to Admission medications   Medication Sig Start Date End Date Taking? Authorizing Provider  albuterol (PROVENTIL) (2.5  MG/3ML) 0.083% nebulizer solution Inhale into the lungs. 06/15/20  Yes [provider]  amLODipine (NORVASC) 5 MG tablet Take by mouth. 06/15/20  Yes [provider]  Fluticasone-Umeclidin-Vilant 100-62.5-25 MCG/INH AEPB Inhale into the lungs. 06/15/20 09/13/20 Yes [provider]  lisinopril (ZESTRIL) 5 MG tablet Take by mouth. 06/15/20  Yes [provider]  cephALEXin (KEFLEX) 250 MG capsule Take 1 capsule (250 mg total) by mouth 4 (four) times daily. 03/22/16   Roxy HorsemanBrowning, Robert, PA-C  ibuprofen (ADVIL,MOTRIN) 200 MG tablet Take 400-600 mg by mouth every 4 (four) hours as needed.    [provider]  magic mouthwash w/lidocaine SOLN Take 5 mLs by mouth 3 (three) times daily for 7 days. 09/13/20 09/20/20  Carroll SageFaulkner, Tyheem Boughner J, PA-C  naproxen sodium (ANAPROX) 220 MG tablet Take 440 mg by mouth 2 (two) times daily as needed (pain).    [provider]  omeprazole (PRILOSEC) 20 MG capsule Take 1 capsule (20 mg total) by mouth daily. 09/13/20 10/13/20  Carroll SageFaulkner, Jansel Vonstein J, PA-C  ondansetron (ZOFRAN) 4 MG tablet Take 1 tablet (4 mg total) by mouth every 6 (six) hours. 09/13/20   Carroll SageFaulkner, Aleksandr Pellow J, PA-C    Allergies    Patient has no known allergies.  Review of Systems   Review  of Systems  Constitutional: Negative for chills and fever.  HENT: Positive for sore throat and trouble swallowing. Negative for congestion, tinnitus and voice change.   Eyes: Negative for visual disturbance.  Respiratory: Negative for shortness of breath.   Cardiovascular: Negative for chest pain.  Gastrointestinal: Negative for abdominal pain, diarrhea, nausea and vomiting.  Genitourinary: Negative for enuresis.  Musculoskeletal: Negative for back pain.  Skin: Negative for rash.  Neurological: Negative for dizziness and headaches.  Hematological: Does not bruise/bleed easily.    Physical Exam Updated Vital Signs BP 133/74    Pulse (!) 102    Temp 98.3 F (36.8 C) (Oral)     Resp 10    Ht 5\' 8"  (1.727 m)    Wt 59 kg    SpO2 98%    BMI 19.77 kg/m   Physical Exam Vitals and nursing note reviewed.  Constitutional:      General: She is not in acute distress.    Appearance: She is not ill-appearing.  HENT:     Head: Normocephalic and atraumatic.     Nose: No congestion.     Mouth/Throat:     Mouth: Mucous membranes are moist.     Pharynx: Oropharynx is clear. No oropharyngeal exudate or posterior oropharyngeal erythema.     Comments: Oropharynx is visualized tongue and uvula are both midline, no food bolus noted. Eyes:     General: No scleral icterus. Cardiovascular:     Rate and Rhythm: Normal rate and regular rhythm.     Pulses: Normal pulses.     Heart sounds: No murmur heard.  No friction rub. No gallop.   Pulmonary:     Effort: No respiratory distress.     Breath sounds: No wheezing, rhonchi or rales.  Abdominal:     General: There is no distension.     Palpations: Abdomen is soft.     Tenderness: There is no abdominal tenderness. There is no right CVA tenderness, left CVA tenderness or guarding.  Musculoskeletal:        General: No swelling.  Skin:    General: Skin is warm and dry.     Findings: No rash.  Neurological:     Mental Status: She is alert.  Psychiatric:        Mood and Affect: Mood normal.     ED Results / Procedures / Treatments   Labs (all labs ordered are listed, but only abnormal results are displayed) Labs Reviewed  RESP PANEL BY RT PCR (RSV, FLU A&B, COVID)    EKG EKG Interpretation  Date/Time:  Monday September 13 2020 16:08:42 EDT Ventricular Rate:  94 PR Interval:    QRS Duration: 75 QT Interval:  343 QTC Calculation: 429 R Axis:   89 Text Interpretation: Sinus rhythm Probable anteroseptal infarct, old Nonspecific T abnormalities, lateral leads No significant change since last tracing Confirmed by 11-03-1981 [phone]) on 09/13/2020 4:12:02 PM   Radiology DG Chest 2 View  Result Date:  09/13/2020 CLINICAL DATA:  Choking episode at 1 p.m., cough, possible impacted food bolus EXAM: CHEST - 2 VIEW COMPARISON:  05/18/2020 FINDINGS: Frontal and lateral views of the chest demonstrate an unremarkable cardiac silhouette. No airspace disease, effusion, or pneumothorax. Lungs are mildly hyperinflated. No acute bony abnormalities. No radiopaque foreign body. IMPRESSION: 1. Mild hyperinflation. No acute airspace disease. No radiopaque foreign body. Electronically Signed   By: 07/19/2020 M.D.   On: 09/13/2020 16:50    Procedures Procedures (including critical care time)  Medications Ordered in ED Medications  propofol (DIPRIVAN) 10 mg/mL bolus/IV push 200 mg (50 mg Intravenous See Procedure Record 09/13/20 2015)  ketamine 50 mg in normal saline 5 mL (10 mg/mL) syringe (50 mg Intravenous See Procedure Record 09/13/20 2014)  glucagon (human recombinant) (GLUCAGEN) injection 1 mg (1 mg Intravenous Given 09/13/20 1621)  ondansetron (ZOFRAN) injection 4 mg (4 mg Intravenous Given 09/13/20 1619)  morphine 2 MG/ML injection 2 mg (2 mg Intravenous Given 09/13/20 1716)  ketamine (KETALAR) injection (50 mg Intravenous Given 09/13/20 2014)  propofol (DIPRIVAN) 10 mg/mL bolus/IV push (20 mg Intravenous Given 09/13/20 2018)  morphine 2 MG/ML injection 2 mg (2 mg Intravenous Given 09/13/20 2136)    ED Course  I have reviewed the triage vital signs and the nursing notes.  Pertinent labs & imaging results that were available during my care of the patient were reviewed by me and considered in my medical decision making (see chart for details).    MDM Rules/Calculators/A&P                          Patient presents with possible food bolus.  She was alert, did not appear in acute distress, vital signs significant for tachycardia.  Will provide her with antiemetics, IV glucagon, pain medication x-ray and reevaluate.  Patient's respiratory panel is negative for Covid, influenza A/B, RSV.  Chest x-ray does  not reveal any acute findings.  EKG was sinus tach without signs of ischemia no ST elevation depression noted.  Patient was evaluated after providing her with glucagon. She was given water and immediately vomited the water back up.  She is not tolerating p.o. will consult  GI for further recommendations.  Spoke with Dr. Rosalia Hammers of [facility] who feels patient has possible food impaction.  She states she will come and perform a EGD in the emergency department.  EGD was performed today under sedation and a large piece of sausage was removed from patient esophagus.  She tolerated the procedure well.  She is tolerating p.o. without difficulty. Dr. Rosalia Hammers recommends patient soft diet and start her on PPI with Magic mouthwash.  GI office will contact patient tomorrow to schedule a follow-up appointment.  I have low suspicion for foreign body ingestion as x-ray does not reveal any acute findings.  Low suspicion for foreign body stuck in patient's airway as lung sounds were clear bilaterally no wheezing or stridor noted on exam.  Low suspicion for retropharyngeal abscess, peritonsillar abscess, Ludwig angina as oropharynx was visualized no edema noted in the posterior pharynx, no exudates or erythema noted.  Low suspicion for systemic infection as patient nontoxic-appearing, vital signs reassuring.  I suspect tachycardia secondary to the pain in her throat.  Will provide patient with PPIs and Magic mouthwash, encourage soft food diet and have her follow-up with GI for further evaluation.  Vital signs have remained stable, no indication for hospital admission.  Patient discussed with attending and they agreed with assessment and plan.  Patient given at home care as well strict return precautions.  Patient verbalized that they understood agreed to said plan.  Final Clinical Impression(s) / ED Diagnoses Final diagnoses:  Esophageal obstruction due to food impaction    Rx / DC Orders ED Discharge Orders          Ordered    omeprazole (PRILOSEC) 20 MG capsule  Daily        09/13/20 2118    magic mouthwash w/lidocaine SOLN  3 times daily,   Status:  Discontinued        09/13/20 2118    magic mouthwash w/lidocaine SOLN  3 times daily        09/13/20 2130    ondansetron (ZOFRAN) 4 MG tablet  Every 6 hours        09/13/20 2132           Carroll Sage, PA-C 09/13/20 1916    Carroll Sage, PA-C 09/13/20 2152    Vanetta Mulders, MD 09/14/20 [phone]

## 2020-09-13 NOTE — Op Note (Addendum)
[facility] Patient Name: Candice Cooper Procedure Date: 09/13/2020 MRN: [id number] Attending MD: Napoleon Form , MD Date of Birth: [birth year] CSN: [id number] Age: 59 Admit Type: Inpatient Procedure:                Upper GI endoscopy Indications:              Foreign body in the esophagus/Acute food impaction Providers:                Napoleon Form, MD, Adolph Pollack, RN,                            Leanne Lovely, Technician Referring MD:              Medicines:                Fentanyl 150 micrograms IV, Midazolam 6 mg IV,                            Diphenhydramine 25 mg IV. Unable to adequately                            sedate patient, further assitance provider by ER                            physician with Ketamine and IV propofol Complications:            No immediate complications. Estimated Blood Loss:     Estimated blood loss was minimal. Procedure:                Pre-Anesthesia Assessment:                           - Prior to the procedure, a History and Physical                            was performed, and patient medications and                            allergies were reviewed. The patient's tolerance of                            previous anesthesia was also reviewed. The risks                            and benefits of the procedure and the sedation                            options and risks were discussed with the patient.                            All questions were answered, and informed consent                            was obtained. Prior Anticoagulants: The patient has  taken no previous anticoagulant or antiplatelet                            agents. ASA Grade Assessment: II - A patient with                            mild systemic disease. After reviewing the risks                            and benefits, the patient was deemed in                            satisfactory condition to undergo the  procedure.                           After obtaining informed consent, the endoscope was                            passed under direct vision. Throughout the                            procedure, the patient's blood pressure, pulse, and                            oxygen saturations were monitored continuously. The                            GIF-H190 ([id number]) Olympus gastroscope was                            introduced through the mouth, and advanced to the                            second part of duodenum. The upper GI endoscopy was                            technically difficult and complex due to presence                            of food, the patient's inability to cooperate and                            the patient's inability to tolerate conscious                            sedation. Successful completion of the procedure                            was aided by receiving assistance from additional                            staff/ER provider and RN. The patient tolerated the  procedure well. Scope In: Scope Out: Findings:      Food was found in the upper third of the esophagus. Large food bolus was       impacted in proximal esophagus, unable to dislodge with with roth net.       Removal of food was accomplished with suction cap.      Mildly severe esophagitis with bleeding was found 22 to 25 cm from the       incisors at site of food bolus impaction.      LA Grade C (one or more mucosal breaks continuous between tops of 2 or       more mucosal folds, less than 75% circumference) esophagitis with       cratered linear ulcers and no bleeding was found 33 to 36 cm from the       incisors.      A medium-sized hiatal hernia was present.      The stomach was normal.      The examined duodenum was normal. Impression:               - Food in the upper third of the esophagus. Removal                            was successful.                           -  Mildly severe acute esophagitis with bleeding at                            site of food impaction in proximal esophagus.                           - LA Grade C reflux esophagitis with no bleeding.                           - Medium-sized hiatal hernia.                           - Normal stomach.                           - Normal examined duodenum. Moderate Sedation:      Moderate (conscious) sedation was administered by the endoscopy nurse       and supervised by the endoscopist. The patient's oxygen saturation,       heart rate, blood pressure and response to care were monitored. Total       physician intraservice time was 68 minutes. Recommendation:           - Clear liquid diet - advance as tolerated to soft                            diet for 5-7 days.                           - Use Protonix (pantoprazole) 40 mg PO BID for 3  months.                           - Magic mouthwash 5cc swish and swallow q6h prn X                            1week                           - Follow an antireflux regimen indefinitely.                           - Follow up in GI office at the next available                            appointment. Please call [phone] to schedule                            appointment                           - Ok to discharge home from GI standpoint Procedure Code(s):        --- Professional ---                           [id number], Esophagogastroduodenoscopy, flexible,                            transoral; with removal of foreign body(s)                           99152, 59, Moderate sedation services provided by                            the same physician or other qualified health care                            professional performing the diagnostic or                            therapeutic service that the sedation supports,                            requiring the presence of an independent trained                            observer to  assist in the monitoring of the                            patient's level of consciousness and physiological                            status; initial 15 minutes of intraservice time,                            patient  age 35 years or older                           [phone], Moderate sedation; each additional 15                            minutes intraservice time                           99153, Moderate sedation; each additional 15                            minutes intraservice time                           99153, Moderate sedation; each additional 15                            minutes intraservice time                           99153, Moderate sedation; each additional 15                            minutes intraservice time Diagnosis Code(s):        --- Professional ---                           M08.676P, Food in esophagus causing other injury,                            initial encounter                           K21.00, Gastro-esophageal reflux disease with                            esophagitis, without bleeding                           K44.9, Diaphragmatic hernia without obstruction or                            gangrene                           T18.108A, Unspecified foreign body in esophagus                            causing other injury, initial encounter CPT copyright 2019 [employer]. All rights reserved. The codes documented in this report are preliminary and upon coder review may  be revised to meet current compliance requirements. Napoleon Form, MD 09/13/2020 9:04:05 PM This report has been signed electronically. Number of Addenda: 0

## 2020-09-13 NOTE — ED Triage Notes (Signed)
Pt BIB EMS. Pt states eating a piece of sausage about an hour ago and it getting stuck in her throat. Pt is maintaining airway but can not swallow anything down without it coming back up.   BP-120/70 HR-112 RR-18 O2-98% on 3LNC (baseline)

## 2020-09-13 NOTE — Discharge Instructions (Addendum)
Seen here for food impaction.  We have removed a piece of sausage from your esophagus.  I recommend a soft diet for the next 7 days.  I have also prescribed you acid pills and Magic mouthwash please use as prescribed.  You may also take over-the-counter pain medications like ibuprofen or Tylenol every 6 as needed please follow dosing the back of bottle.  The GI office will call you within the next few days if you do not hear from them please contact them. thier information is found above.  Come back to the emergency department if you develop chest pain, shortness of breath, severe abdominal pain, uncontrolled nausea, vomiting, diarrhea.

## 2020-09-13 NOTE — Progress Notes (Signed)
During bedside EGD, patient was not tolerating procedure after 6 of versed, 125 of fentanyl and 25 of benadryl, Dr. Lavon Paganini request help from Dr. Silverio Lay, ED physician, to help with sedation at 1955. ED team of RNs to bedside to help Dr. Silverio Lay sedate patient. 2023 Dr. Lavon Paganini was able to remove piece of sausage from patients proximal esophagus and the scope was removed at 2025. ED RN recovering patient per protocol. Handoff was given to Alvino Chapel, Charity fundraiser.

## 2020-09-13 NOTE — ED Provider Notes (Signed)
Medical screening examination/treatment/procedure(s) were conducted as a shared visit with non-physician practitioner(s) and myself.  I personally evaluated the patient during the encounter.  EKG Interpretation  Date/Time:  Monday September 13 2020 16:08:42 EDT Ventricular Rate:  94 PR Interval:    QRS Duration: 75 QT Interval:  343 QTC Calculation: 429 R Axis:   89 Text Interpretation: Sinus rhythm Probable anteroseptal infarct, old Nonspecific T abnormalities, lateral leads No significant change since last tracing Confirmed by Vanetta Mulders [phone]) on 09/13/2020 4:12:02 PM   Patient seen by me along with physician assistant.  Patient appears to have a food impaction.  Occurred at 1:00 while eating.  It was sausage.  Gotten stuck in the upper part of her sternal area patient not able to swallow water or saliva she is having to spit it out.  Patient here will have work-up with labs IV her EKG shows no acute changes.  Chest x-ray just to make sure no abnormalities there.  Patient given glucagon.  If this does not resolve the thing by the time chest x-ray is done gastroenterology will be contacted for the food impaction.   Vanetta Mulders, MD 09/13/20 [phone]

## 2020-09-13 NOTE — Consult Note (Signed)
Consultation  Referring Provider:     ER Primary Care Physician:  Pcp, No Primary Gastroenterologist:        Gentry Fitz Reason for Consultation:    Food impaction             HPI:   Candice Cooper is a 59 y.o. female admitted with choking sensation after she ate sausage for lunch.  She cannot swallow any liquids or saliva concerning for acute food impaction. No significant history of esophageal strictures or dysphagia or odynophagia  Past Medical History:  Diagnosis Date  . COPD (chronic obstructive pulmonary disease) (HCC)     Past Surgical History:  Procedure Laterality Date  . CESAREAN SECTION    . ECTOPIC PREGNANCY SURGERY    . TUBAL LIGATION      Family History  Problem Relation Age of Onset  . Diabetes Brother      Social History   Tobacco Use  . Smoking status: Current Every Day Smoker  Substance Use Topics  . Alcohol use: Yes  . Drug use: Yes    Types: Marijuana    Comment: last use today     Prior to Admission medications   Medication Sig Start Date End Date Taking? Authorizing Provider  albuterol (PROVENTIL) (2.5 MG/3ML) 0.083% nebulizer solution Inhale into the lungs. 06/15/20  Yes [provider]  amLODipine (NORVASC) 5 MG tablet Take by mouth. 06/15/20  Yes [provider]  lisinopril (ZESTRIL) 5 MG tablet Take by mouth. 06/15/20  Yes [provider]  cephALEXin (KEFLEX) 250 MG capsule Take 1 capsule (250 mg total) by mouth 4 (four) times daily. 03/22/16   Roxy Horseman, PA-C  Fluticasone-Umeclidin-Vilant 100-62.5-25 MCG/INH AEPB Inhale into the lungs. 06/15/20 09/13/20  [provider]  ibuprofen (ADVIL,MOTRIN) 200 MG tablet Take 400-600 mg by mouth every 4 (four) hours as needed.    [provider]  naproxen sodium (ANAPROX) 220 MG tablet Take 440 mg by mouth 2 (two) times daily as needed (pain).    [provider]    No current facility-administered medications for this encounter.   Current  Outpatient Medications  Medication Sig Dispense Refill  . albuterol (PROVENTIL) (2.5 MG/3ML) 0.083% nebulizer solution Inhale into the lungs.    Marland Kitchen amLODipine (NORVASC) 5 MG tablet Take by mouth.    Marland Kitchen lisinopril (ZESTRIL) 5 MG tablet Take by mouth.    . cephALEXin (KEFLEX) 250 MG capsule Take 1 capsule (250 mg total) by mouth 4 (four) times daily. 28 capsule 0  . Fluticasone-Umeclidin-Vilant 100-62.5-25 MCG/INH AEPB Inhale into the lungs.    Marland Kitchen ibuprofen (ADVIL,MOTRIN) 200 MG tablet Take 400-600 mg by mouth every 4 (four) hours as needed.    . naproxen sodium (ANAPROX) 220 MG tablet Take 440 mg by mouth 2 (two) times daily as needed (pain).      Allergies as of 09/13/2020  . (No Known Allergies)     Review of Systems:    This is positive for those things mentioned in the HPIAll other review of systems are negative.       Physical Exam:  Vital signs in last 24 hours: Temp:  [98.3 F (36.8 C)] 98.3 F (36.8 C) (11/01 1505) Pulse Rate:  [102-107] 107 (11/01 1731) Resp:  [20-23] 23 (11/01 1731) BP: (128-148)/(65-85) 128/65 (11/01 1731) SpO2:  [94 %-96 %] 94 % (11/01 1731) Weight:  [59 kg] 59 kg (11/01 1506)    General:  Well-developed, well-nourished and in no acute distress Eyes:  anicteric. ENT:   Mouth and posterior pharynx free of lesions.  Neck:   supple w/o thyromegaly or mass.  Lungs: Clear to auscultation bilaterally. Heart:   S1S2  Extremities:   no edema Skin   no rash. Neuro:  A&O x 3.  Psych:  appropriate mood and  Affect.   Data Reviewed:   LAB RESULTS: No results for input(s): WBC, HGB, HCT, PLT in the last 72 hours. BMET No results for input(s): NA, K, CL, CO2, GLUCOSE, BUN, CREATININE, CALCIUM in the last 72 hours. LFT No results for input(s): PROT, ALBUMIN, AST, ALT, ALKPHOS, BILITOT, BILIDIR, IBILI in the last 72 hours. PT/INR No results for input(s): LABPROT, INR in the last 72 hours.  STUDIES: DG Chest 2 View  Result Date: 09/13/2020 CLINICAL  DATA:  Choking episode at 1 p.m., cough, possible impacted food bolus EXAM: CHEST - 2 VIEW COMPARISON:  05/18/2020 FINDINGS: Frontal and lateral views of the chest demonstrate an unremarkable cardiac silhouette. No airspace disease, effusion, or pneumothorax. Lungs are mildly hyperinflated. No acute bony abnormalities. No radiopaque foreign body. IMPRESSION: 1. Mild hyperinflation. No acute airspace disease. No radiopaque foreign body. Electronically Signed   By: Sharlet Salina M.D.   On: 09/13/2020 16:50     PREVIOUS ENDOSCOPIES:            None    Impression / Plan:   59 year old female scented to ER with acute food impaction  Plan to proceed with EGD for disimpaction and further evaluation  The risks and benefits as well as alternatives of endoscopic procedure(s) have been discussed and reviewed. All questions answered. The patient agrees to proceed.       Iona Beard , MD [phone]

## 2020-09-13 NOTE — Sedation Documentation (Signed)
Foreign object in throat removed by Dr Lavon Paganini.

## 2020-09-13 NOTE — ED Notes (Signed)
Pt ambulated to the bathroom placed pt back on monitor

## 2020-09-14 ENCOUNTER — Encounter: Payer: Self-pay | Admitting: Physician Assistant

## 2020-09-14 ENCOUNTER — Telehealth: Payer: Self-pay

## 2020-09-14 ENCOUNTER — Other Ambulatory Visit: Payer: Self-pay

## 2020-09-14 NOTE — Telephone Encounter (Signed)
-----   Message from Napoleon Form, MD sent at 09/13/2020  8:44 PM EDT ----- Please schedule this patient with APP or me, next available office appointment . I removed large food bolus, impacted in proximal esophagus. Has esophagitis. Will need repeat EGD and dilation in 6-8 weeks.  Thanks

## 2020-09-14 NOTE — Telephone Encounter (Signed)
New patient appointment made for the patient.  New patient information mailed.

## 2020-09-15 ENCOUNTER — Encounter (HOSPITAL_COMMUNITY): Payer: Self-pay | Admitting: Gastroenterology

## 2020-10-15 ENCOUNTER — Ambulatory Visit: Payer: Medicaid Other | Admitting: Physician Assistant

## 2021-03-19 ENCOUNTER — Emergency Department (HOSPITAL_COMMUNITY): Payer: Medicaid Other

## 2021-03-19 ENCOUNTER — Encounter (HOSPITAL_COMMUNITY): Payer: Self-pay

## 2021-03-19 ENCOUNTER — Other Ambulatory Visit: Payer: Self-pay

## 2021-03-19 ENCOUNTER — Inpatient Hospital Stay (HOSPITAL_COMMUNITY)
Admission: EM | Admit: 2021-03-19 | Discharge: 2021-03-22 | DRG: 871 | Disposition: A | Discharge status: Home / Self Care | Payer: Medicaid Other | Attending: Internal Medicine | Admitting: Internal Medicine

## 2021-03-19 DIAGNOSIS — J441 Chronic obstructive pulmonary disease with (acute) exacerbation: Secondary | ICD-10-CM | POA: Diagnosis present

## 2021-03-19 DIAGNOSIS — E871 Hypo-osmolality and hyponatremia: Secondary | ICD-10-CM | POA: Diagnosis present

## 2021-03-19 DIAGNOSIS — Z20822 Contact with and (suspected) exposure to covid-19: Secondary | ICD-10-CM | POA: Diagnosis present

## 2021-03-19 DIAGNOSIS — F17210 Nicotine dependence, cigarettes, uncomplicated: Secondary | ICD-10-CM | POA: Diagnosis present

## 2021-03-19 DIAGNOSIS — Z888 Allergy status to other drugs, medicaments and biological substances status: Secondary | ICD-10-CM

## 2021-03-19 DIAGNOSIS — R0902 Hypoxemia: Secondary | ICD-10-CM

## 2021-03-19 DIAGNOSIS — Z9981 Dependence on supplemental oxygen: Secondary | ICD-10-CM

## 2021-03-19 DIAGNOSIS — F319 Bipolar disorder, unspecified: Secondary | ICD-10-CM | POA: Diagnosis present

## 2021-03-19 DIAGNOSIS — A4189 Other specified sepsis: Secondary | ICD-10-CM | POA: Diagnosis not present

## 2021-03-19 DIAGNOSIS — K5900 Constipation, unspecified: Secondary | ICD-10-CM | POA: Diagnosis present

## 2021-03-19 DIAGNOSIS — Z8616 Personal history of COVID-19: Secondary | ICD-10-CM | POA: Diagnosis not present

## 2021-03-19 DIAGNOSIS — R6520 Severe sepsis without septic shock: Secondary | ICD-10-CM | POA: Diagnosis present

## 2021-03-19 DIAGNOSIS — Z9851 Tubal ligation status: Secondary | ICD-10-CM | POA: Diagnosis not present

## 2021-03-19 DIAGNOSIS — I10 Essential (primary) hypertension: Secondary | ICD-10-CM | POA: Diagnosis present

## 2021-03-19 DIAGNOSIS — Z720 Tobacco use: Secondary | ICD-10-CM

## 2021-03-19 DIAGNOSIS — E860 Dehydration: Secondary | ICD-10-CM | POA: Diagnosis present

## 2021-03-19 DIAGNOSIS — J101 Influenza due to other identified influenza virus with other respiratory manifestations: Secondary | ICD-10-CM | POA: Diagnosis not present

## 2021-03-19 DIAGNOSIS — E876 Hypokalemia: Secondary | ICD-10-CM | POA: Diagnosis present

## 2021-03-19 DIAGNOSIS — Z597 Insufficient social insurance and welfare support: Secondary | ICD-10-CM

## 2021-03-19 DIAGNOSIS — J9621 Acute and chronic respiratory failure with hypoxia: Secondary | ICD-10-CM | POA: Diagnosis present

## 2021-03-19 LAB — HEPATIC FUNCTION PANEL
ALT: 25 U/L (ref 0–44)
AST: 54 U/L — ABNORMAL HIGH (ref 15–41)
Albumin: 3.4 g/dL — ABNORMAL LOW (ref 3.5–5.0)
Alkaline Phosphatase: 51 U/L (ref 38–126)
Bilirubin, Direct: 0.1 mg/dL (ref 0.0–0.2)
Indirect Bilirubin: 0.2 mg/dL — ABNORMAL LOW (ref 0.3–0.9)
Total Bilirubin: 0.3 mg/dL (ref 0.3–1.2)
Total Protein: 7.8 g/dL (ref 6.5–8.1)

## 2021-03-19 LAB — BASIC METABOLIC PANEL
Anion gap: 15 (ref 5–15)
Anion gap: 14 (ref 5–15)
BUN: 9 mg/dL (ref 6–20)
BUN: 11 mg/dL (ref 6–20)
CO2: 29 mmol/L (ref 22–32)
CO2: 29 mmol/L (ref 22–32)
Calcium: 8.5 mg/dL — ABNORMAL LOW (ref 8.9–10.3)
Calcium: 7.7 mg/dL — ABNORMAL LOW (ref 8.9–10.3)
Chloride: 83 mmol/L — ABNORMAL LOW (ref 98–111)
Chloride: 89 mmol/L — ABNORMAL LOW (ref 98–111)
Creatinine, Ser: 0.78 mg/dL (ref 0.44–1.00)
Creatinine, Ser: 0.87 mg/dL (ref 0.44–1.00)
GFR, Estimated: >60 mL/min (ref >60)
GFR, Estimated: >60 mL/min (ref >60)
Glucose, Bld: 121 mg/dL — ABNORMAL HIGH (ref 70–99)
Glucose, Bld: 144 mg/dL — ABNORMAL HIGH (ref 70–99)
Potassium: 3.2 mmol/L — ABNORMAL LOW (ref 3.5–5.1)
Potassium: 3.2 mmol/L — ABNORMAL LOW (ref 3.5–5.1)
Sodium: 127 mmol/L — ABNORMAL LOW (ref 135–145)
Sodium: 132 mmol/L — ABNORMAL LOW (ref 135–145)

## 2021-03-19 LAB — CBC WITH DIFFERENTIAL/PLATELET
Abs Immature Granulocytes: 0.03 10*3/uL (ref 0.00–0.07)
Basophils Absolute: 0 10*3/uL (ref 0.0–0.1)
Basophils Relative: 0 %
Eosinophils Absolute: 0 10*3/uL (ref 0.0–0.5)
Eosinophils Relative: 0 %
HCT: 44.5 % (ref 36.0–46.0)
Hemoglobin: 14.6 g/dL (ref 12.0–15.0)
Immature Granulocytes: 1 %
Lymphocytes Relative: 6 %
Lymphs Abs: 0.4 10*3/uL — ABNORMAL LOW (ref 0.7–4.0)
MCH: 32.3 pg (ref 26.0–34.0)
MCHC: 32.8 g/dL (ref 30.0–36.0)
MCV: 98.5 fL (ref 80.0–100.0)
Monocytes Absolute: 0.5 10*3/uL (ref 0.1–1.0)
Monocytes Relative: 8 %
Neutro Abs: 5.4 10*3/uL (ref 1.7–7.7)
Neutrophils Relative %: 85 %
Platelets: 225 10*3/uL (ref 150–400)
RBC: 4.52 MIL/uL (ref 3.87–5.11)
RDW: 14.6 % (ref 11.5–15.5)
WBC: 6.4 10*3/uL (ref 4.0–10.5)
nRBC: 0 % (ref 0.0–0.2)

## 2021-03-19 LAB — URINALYSIS, ROUTINE W REFLEX MICROSCOPIC
Bacteria, UA: NONE SEEN
Bilirubin Urine: NEGATIVE
Glucose, UA: NEGATIVE mg/dL
Hgb urine dipstick: NEGATIVE
Ketones, ur: 20 mg/dL — AB
Leukocytes,Ua: NEGATIVE
Nitrite: NEGATIVE
Protein, ur: 100 mg/dL — AB
Specific Gravity, Urine: 1.016 (ref 1.005–1.030)
pH: 6 (ref 5.0–8.0)

## 2021-03-19 LAB — POC SARS CORONAVIRUS 2 AG -  ED: SARSCOV2ONAVIRUS 2 AG: NEGATIVE

## 2021-03-19 LAB — LACTIC ACID, PLASMA
Lactic Acid, Venous: 2.4 mmol/L (ref 0.5–1.9)
Lactic Acid, Venous: 3 mmol/L (ref 0.5–1.9)
Lactic Acid, Venous: 4.4 mmol/L (ref 0.5–1.9)
Lactic Acid, Venous: 3.2 mmol/L (ref 0.5–1.9)

## 2021-03-19 LAB — BRAIN NATRIURETIC PEPTIDE: B Natriuretic Peptide: 129.1 pg/mL — ABNORMAL HIGH (ref 0.0–100.0)

## 2021-03-19 LAB — BLOOD GAS, VENOUS
Acid-Base Excess: 3.3 mmol/L — ABNORMAL HIGH (ref 0.0–2.0)
Bicarbonate: 31.1 mmol/L — ABNORMAL HIGH (ref 20.0–28.0)
O2 Saturation: 56.3 %
Patient temperature: 98.6
pCO2, Ven: 63.8 mmHg — ABNORMAL HIGH (ref 44.0–60.0)
pH, Ven: 7.31 (ref 7.250–7.430)
pO2, Ven: 35.4 mmHg (ref 32.0–45.0)

## 2021-03-19 LAB — RESP PANEL BY RT-PCR (FLU A&B, COVID) ARPGX2
Influenza A by PCR: POSITIVE — AB
Influenza B by PCR: NEGATIVE
SARS Coronavirus 2 by RT PCR: NEGATIVE

## 2021-03-19 MED ORDER — IPRATROPIUM-ALBUTEROL 0.5-2.5 (3) MG/3ML IN SOLN
3.0000 mL | RESPIRATORY_TRACT | Status: DC
  Administered 2021-03-19 – 2021-03-20 (×5): 3 mL via RESPIRATORY_TRACT
  Filled 2021-03-19 (×5): qty 3

## 2021-03-19 MED ORDER — OSELTAMIVIR PHOSPHATE 75 MG PO CAPS
75.0000 mg | ORAL_CAPSULE | Freq: Once | ORAL | Status: AC
  Administered 2021-03-19: 75 mg via ORAL
  Filled 2021-03-19: qty 1

## 2021-03-19 MED ORDER — SODIUM CHLORIDE 0.9 % IV BOLUS
500.0000 mL | Freq: Once | INTRAVENOUS | Status: AC
  Administered 2021-03-19: 500 mL via INTRAVENOUS

## 2021-03-19 MED ORDER — MAGNESIUM SULFATE 2 GM/50ML IV SOLN
2.0000 g | Freq: Once | INTRAVENOUS | Status: AC
  Administered 2021-03-19: 2 g via INTRAVENOUS
  Filled 2021-03-19: qty 50

## 2021-03-19 MED ORDER — AMLODIPINE BESYLATE 5 MG PO TABS
5.0000 mg | ORAL_TABLET | Freq: Every day | ORAL | Status: DC
  Administered 2021-03-20 – 2021-03-22 (×3): 5 mg via ORAL
  Filled 2021-03-19 (×3): qty 1

## 2021-03-19 MED ORDER — SODIUM CHLORIDE 0.9 % IV SOLN: INTRAVENOUS | Status: AC

## 2021-03-19 MED ORDER — HYDROCOD POLST-CPM POLST ER 10-8 MG/5ML PO SUER
5.0000 mL | Freq: Two times a day (BID) | ORAL | Status: DC | PRN
  Administered 2021-03-20 – 2021-03-22 (×5): 5 mL via ORAL
  Filled 2021-03-19 (×5): qty 5

## 2021-03-19 MED ORDER — ENOXAPARIN SODIUM 40 MG/0.4ML IJ SOSY
40.0000 mg | PREFILLED_SYRINGE | INTRAMUSCULAR | Status: DC
  Administered 2021-03-19 – 2021-03-21 (×3): 40 mg via SUBCUTANEOUS
  Filled 2021-03-19 (×3): qty 0.4

## 2021-03-19 MED ORDER — POTASSIUM CHLORIDE 10 MEQ/100ML IV SOLN
10.0000 meq | INTRAVENOUS | Status: DC
  Administered 2021-03-19: 10 meq via INTRAVENOUS
  Filled 2021-03-19: qty 100

## 2021-03-19 MED ORDER — NICOTINE 7 MG/24HR TD PT24
7.0000 mg | MEDICATED_PATCH | Freq: Every day | TRANSDERMAL | Status: DC
  Administered 2021-03-19 – 2021-03-22 (×4): 7 mg via TRANSDERMAL
  Filled 2021-03-19 (×4): qty 1

## 2021-03-19 MED ORDER — GUAIFENESIN-DM 100-10 MG/5ML PO SYRP
5.0000 mL | ORAL_SOLUTION | ORAL | Status: DC | PRN
  Administered 2021-03-19 – 2021-03-22 (×11): 5 mL via ORAL
  Filled 2021-03-19 (×11): qty 10

## 2021-03-19 MED ORDER — ACETAMINOPHEN 325 MG PO TABS
650.0000 mg | ORAL_TABLET | Freq: Four times a day (QID) | ORAL | Status: DC | PRN
  Administered 2021-03-19 – 2021-03-22 (×8): 650 mg via ORAL
  Filled 2021-03-19 (×8): qty 2

## 2021-03-19 MED ORDER — KETOROLAC TROMETHAMINE 15 MG/ML IJ SOLN
15.0000 mg | Freq: Once | INTRAMUSCULAR | Status: AC
  Administered 2021-03-19: 15 mg via INTRAVENOUS
  Filled 2021-03-19: qty 1

## 2021-03-19 MED ORDER — HYDROCOD POLST-CPM POLST ER 10-8 MG/5ML PO SUER
5.0000 mL | Freq: Every day | ORAL | Status: DC
  Administered 2021-03-19: 5 mL via ORAL
  Filled 2021-03-19: qty 5

## 2021-03-19 MED ORDER — FLUTICASONE FUROATE-VILANTEROL 100-25 MCG/INH IN AEPB
1.0000 | INHALATION_SPRAY | Freq: Every day | RESPIRATORY_TRACT | Status: DC
  Administered 2021-03-20 – 2021-03-22 (×3): 1 via RESPIRATORY_TRACT
  Filled 2021-03-19: qty 28

## 2021-03-19 MED ORDER — SODIUM CHLORIDE 0.9 % IV BOLUS
1000.0000 mL | Freq: Once | INTRAVENOUS | Status: AC
  Administered 2021-03-19: 1000 mL via INTRAVENOUS

## 2021-03-19 MED ORDER — ONDANSETRON HCL 4 MG/2ML IJ SOLN
4.0000 mg | Freq: Once | INTRAMUSCULAR | Status: AC
  Administered 2021-03-19: 4 mg via INTRAVENOUS
  Filled 2021-03-19: qty 2

## 2021-03-19 MED ORDER — ACETAMINOPHEN 325 MG PO TABS
650.0000 mg | ORAL_TABLET | Freq: Once | ORAL | Status: AC
  Administered 2021-03-19: 650 mg via ORAL
  Filled 2021-03-19: qty 2

## 2021-03-19 MED ORDER — POTASSIUM CHLORIDE CRYS ER 20 MEQ PO TBCR
40.0000 meq | EXTENDED_RELEASE_TABLET | Freq: Once | ORAL | Status: AC
  Administered 2021-03-19: 40 meq via ORAL
  Filled 2021-03-19: qty 2

## 2021-03-19 MED ORDER — LISINOPRIL 5 MG PO TABS
5.0000 mg | ORAL_TABLET | Freq: Every day | ORAL | Status: DC
  Administered 2021-03-20 – 2021-03-22 (×3): 5 mg via ORAL
  Filled 2021-03-19 (×3): qty 1

## 2021-03-19 MED ORDER — PANTOPRAZOLE SODIUM 40 MG IV SOLR
40.0000 mg | Freq: Once | INTRAVENOUS | Status: AC
  Administered 2021-03-19: 40 mg via INTRAVENOUS
  Filled 2021-03-19: qty 40

## 2021-03-19 MED ORDER — IBUPROFEN 200 MG PO TABS
600.0000 mg | ORAL_TABLET | Freq: Once | ORAL | Status: AC
  Administered 2021-03-19: 600 mg via ORAL
  Filled 2021-03-19: qty 3

## 2021-03-19 MED ORDER — BENZONATATE 100 MG PO CAPS
200.0000 mg | ORAL_CAPSULE | Freq: Once | ORAL | Status: AC
  Administered 2021-03-19: 200 mg via ORAL
  Filled 2021-03-19: qty 2

## 2021-03-19 MED ORDER — METHYLPREDNISOLONE SODIUM SUCC 125 MG IJ SOLR
60.0000 mg | Freq: Every day | INTRAMUSCULAR | Status: DC
  Administered 2021-03-20: 60 mg via INTRAVENOUS
  Filled 2021-03-19: qty 2

## 2021-03-19 MED ORDER — ALBUTEROL (5 MG/ML) CONTINUOUS INHALATION SOLN
10.0000 mg/h | INHALATION_SOLUTION | Freq: Once | RESPIRATORY_TRACT | Status: AC
  Administered 2021-03-19: 10 mg/h via RESPIRATORY_TRACT
  Filled 2021-03-19: qty 20

## 2021-03-19 MED ORDER — OSELTAMIVIR PHOSPHATE 75 MG PO CAPS
75.0000 mg | ORAL_CAPSULE | Freq: Two times a day (BID) | ORAL | Status: DC
  Administered 2021-03-20 – 2021-03-22 (×5): 75 mg via ORAL
  Filled 2021-03-19 (×6): qty 1

## 2021-03-19 NOTE — H&P (Signed)
History and Physical    Candice Cooper ERD:[id number] DOB: 11/11/1961 DOA: 03/19/2021  PCP: Pcp, No  Patient coming from: Home   I have personally briefly reviewed patient's old medical records in [facility]  Chief Complaint: increasing shortness of breath   HPI: Candice Cooper is a 60 y.o. female with medical history significant for COPD, bipolar disorder who presents with concerns of increasing shortness of breath.  Starting yesterday patient began to note increasing shortness of breath and increased cough. Feels burning chest pain every time she coughs.  States her nebulizer at home was not helping.  She chronically is on 4 L for her COPD.  She also notes headache and back pain.  Felt like she had the flu.  Also had nausea and vomiting twice yesterday.  Denies any diarrhea.  No abdominal pain. She continues to smoke about 2 cigarettes a day especially when she eats.  She would like a nicotine patch. Patient is not currently following with a pulmonologist for COPD since she does not have insurance.  She is currently trying to work with her son to get a lawyer to help her with getting disability to afford insurance.  ED Course: She was febrile up to 102.57F, tachycardic up to 130s and hypoxic down to 84% on her home 4 L and was increased to 5 L.  CBC unremarkable.  Lactate of 2.4 which trended to 3.  Sodium of 127, potassium of 3.2, BG of 121.  Influenza A positive.  She was given 125 mg Solu-Medrol, DuoNeb on route by EMS.  In the ED, she was given continues albuterol with improvement in her symptoms.  She was also started on [facility] and hospitalist was called for admission.  Review of Systems: Constitutional: No Weight Change,+ Fever ENT/Mouth: No sore throat, No Rhinorrhea Eyes: No Eye Pain, No Vision Changes Cardiovascular: + Chest Pain, + SOB, No PND, + Dyspnea on Exertion, No Orthopnea, No Claudication, No Edema, No Palpitations Respiratory: No Cough, No Sputum, No Wheezing,  no Dyspnea  Gastrointestinal: + Nausea, + Vomiting, No Diarrhea, No Constipation, No Pain Genitourinary: no Urinary Incontinence, No Urgency, No Flank Pain Musculoskeletal: No Arthralgias, No Myalgias Skin: No Skin Lesions, No Pruritus, Neuro: no Weakness, No Numbness Psych: No Anxiety/Panic, No Depression, no decrease appetite Heme/Lymph: No Bruising, No Bleeding  Past Medical History:  Diagnosis Date  . COPD (chronic obstructive pulmonary disease) (HCC)     Past Surgical History:  Procedure Laterality Date  . CESAREAN SECTION    . ECTOPIC PREGNANCY SURGERY    . ESOPHAGOGASTRODUODENOSCOPY N/A 09/13/2020   Procedure: ESOPHAGOGASTRODUODENOSCOPY (EGD);  Surgeon: Napoleon Form, MD;  Location: [facility];  Service: Endoscopy;  Laterality: N/A;  . FOREIGN BODY REMOVAL  09/13/2020   Procedure: FOREIGN BODY REMOVAL;  Surgeon: Napoleon Form, MD;  Location: [facility];  Service: Endoscopy;;  . TUBAL LIGATION       reports that she has been smoking. She does not have any smokeless tobacco history on file. She reports current alcohol use. She reports current drug use. Drug: Marijuana. Social History  No Known Allergies  Family History  Problem Relation Age of Onset  . Diabetes Brother      Prior to Admission medications   Medication Sig Start Date End Date Taking? Authorizing Provider  albuterol (PROVENTIL) (2.5 MG/3ML) 0.083% nebulizer solution Inhale into the lungs. 06/15/20   [provider]  amLODipine (NORVASC) 5 MG tablet Take by mouth. 06/15/20   [provider]  cephALEXin (  KEFLEX) 250 MG capsule Take 1 capsule (250 mg total) by mouth 4 (four) times daily. 03/22/16   Roxy Horseman, PA-C  ibuprofen (ADVIL,MOTRIN) 200 MG tablet Take 400-600 mg by mouth every 4 (four) hours as needed.    [provider]  lisinopril (ZESTRIL) 5 MG tablet Take by mouth. 06/15/20   [provider]  naproxen sodium (ANAPROX) 220 MG tablet Take 440 mg by  mouth 2 (two) times daily as needed (pain).    [provider]  omeprazole (PRILOSEC) 20 MG capsule Take 1 capsule (20 mg total) by mouth daily. 09/13/20 10/13/20  Carroll Sage, PA-C  ondansetron (ZOFRAN) 4 MG tablet Take 1 tablet (4 mg total) by mouth every 6 (six) hours. 09/13/20   Carroll Sage, PA-C    Physical Exam: Vitals:   03/19/21 1715 03/19/21 1745 03/19/21 1800 03/19/21 1815  BP:  138/65 136/69 104/71  Pulse:  (!) 114 (!) 108 (!) 117  Resp:  (!) 21 (!) 23 (!) 31  Temp:  99 F (37.2 C)    TempSrc:  Oral    SpO2: 93% 100% 92% 91%    Constitutional: NAD, calm, comfortable, elderly female appearing older than her stated age sitting upright in bed eating sandwich Vitals:   03/19/21 1715 03/19/21 1745 03/19/21 1800 03/19/21 1815  BP:  138/65 136/69 104/71  Pulse:  (!) 114 (!) 108 (!) 117  Resp:  (!) 21 (!) 23 (!) 31  Temp:  99 F (37.2 C)    TempSrc:  Oral    SpO2: 93% 100% 92% 91%   Eyes: PERRL, lids and conjunctivae normal ENMT: Mucous membranes are moist.  Neck: normal, supple,  Respiratory: Decreased aeration throughout with bibasilar crackles and faint expiratory wheeze.  Frequent deep cough.  Normal respiratory effort on 5 L via nasal cannula. No accessory muscle use.  Cardiovascular: Regular rate and rhythm, no murmurs / rubs / gallops. No extremity edema.  Abdomen: no tenderness, no masses palpated.  Bowel sounds positive.  Musculoskeletal: no clubbing / cyanosis. No joint deformity upper and lower extremities. Good ROM, no contractures. Normal muscle tone.  Skin:old healed blister scar on pretibial region of the left lower extremity Neurologic: CN 2-12 grossly intact. Sensation intact,Strength 5/5 in all 4.  Psychiatric: Normal judgment and insight. Alert and oriented x 3. Normal mood.     Labs on Admission: I have personally reviewed following labs and imaging studies  CBC: Recent Labs  Lab 03/19/21 1614  WBC 6.4  NEUTROABS 5.4  HGB  14.6  HCT 44.5  MCV 98.5  PLT 225   Basic Metabolic Panel: Recent Labs  Lab 03/19/21 1614  NA 127*  K 3.2*  CL 83*  CO2 29  GLUCOSE 121*  BUN 9  CREATININE 0.78  CALCIUM 8.5*   GFR: CrCl cannot be calculated (Unknown ideal weight.). Liver Function Tests: Recent Labs  Lab 03/19/21 1614  AST 54*  ALT 25  ALKPHOS 51  BILITOT 0.3  PROT 7.8  ALBUMIN 3.4*   No results for input(s): LIPASE, AMYLASE in the last 168 hours. No results for input(s): AMMONIA in the last 168 hours. Coagulation Profile: No results for input(s): INR, PROTIME in the last 168 hours. Cardiac Enzymes: No results for input(s): CKTOTAL, CKMB, CKMBINDEX, TROPONINI in the last 168 hours. BNP (last 3 results) No results for input(s): PROBNP in the last 8760 hours. HbA1C: No results for input(s): HGBA1C in the last 72 hours. CBG: No results for input(s): GLUCAP in the  last 168 hours. Lipid Profile: No results for input(s): CHOL, HDL, LDLCALC, TRIG, CHOLHDL, LDLDIRECT in the last 72 hours. Thyroid Function Tests: No results for input(s): TSH, T4TOTAL, FREET4, T3FREE, THYROIDAB in the last 72 hours. Anemia Panel: No results for input(s): VITAMINB12, FOLATE, FERRITIN, TIBC, IRON, RETICCTPCT in the last 72 hours. Urine analysis:    Component Value Date/Time   COLORURINE YELLOW 03/19/2021 1707   APPEARANCEUR CLEAR 03/19/2021 1707   LABSPEC 1.016 03/19/2021 1707   PHURINE 6.0 03/19/2021 1707   GLUCOSEU NEGATIVE 03/19/2021 1707   HGBUR NEGATIVE 03/19/2021 1707   BILIRUBINUR NEGATIVE 03/19/2021 1707   KETONESUR 20 (A) 03/19/2021 1707   PROTEINUR 100 (A) 03/19/2021 1707   UROBILINOGEN 1.0 12/25/2009 1840   NITRITE NEGATIVE 03/19/2021 1707   LEUKOCYTESUR NEGATIVE 03/19/2021 1707    Radiological Exams on Admission: DG Chest Port 1 View  Result Date: 03/19/2021 CLINICAL DATA:  Shortness of breath. EXAM: PORTABLE CHEST 1 VIEW COMPARISON:  September 13, 2020 FINDINGS: The lungs are hyperinflated. There is  no evidence of acute infiltrate, pleural effusion or pneumothorax. The heart size and mediastinal contours are within normal limits. Moderate to marked severity calcification of the aortic arch is noted. The visualized skeletal structures are unremarkable. IMPRESSION: No active cardiopulmonary disease. Electronically Signed   By: Aram Candela M.D.   On: 03/19/2021 15:48      Assessment/Plan  Acute On chronic hypoxemic respiratory failure secondary to COPD exacerbation from influenza A -Baseline on 4 L.  Admitted on 5 L. - Maintain O2 between 88-92% -Scheduled q4hr DuoNeb -Daily IV 40 mg Solu-Medrol - Flutter valve and incentive spirometry - qHS Tussionex for severe cough   Sepsis secondary to Influenza A -Patient presented with fever, tachycardia, and tachypnea. - BID oseltamavir  Hyponatremia  - likely from dehydration  - repeat BMP - continuous IV NS 75cc/hr fluid  Hypokalemia - replete with IV K   Hypertension -continue amlodipine and lisinopril  Tobacco use -Continues to use about 2 cigarettes with her meals a day -Encourage cessation -Low-dose nicotine patch   DVT prophylaxis:.Lovenox Code Status: Full Family Communication: Plan discussed with patient at bedside  disposition Plan: Home with at least 2 midnight stays  Consults called:  Admission status: inpatient  Level of care: Telemetry  Status is: Inpatient  Remains inpatient appropriate because:Inpatient level of care appropriate due to severity of illness   Dispo: The patient is from: Home              Anticipated d/c is to: Home              Patient currently is not medically stable to d/c.   Difficult to place patient No         Anselm Jungling DO [facility]   If 7PM-7AM, please contact night-coverage [email or web address]   03/19/2021, 7:19 PM

## 2021-03-19 NOTE — Progress Notes (Signed)
MD notified lactic 3.2

## 2021-03-19 NOTE — ED Notes (Signed)
Patient assisted to and from bedside commode at this time, provided with [place] sandwich per request

## 2021-03-19 NOTE — ED Notes (Signed)
Patient changed into new brief, provided with warm blankets at this time

## 2021-03-19 NOTE — ED Triage Notes (Signed)
Coming from friend's house, out of meds for 3 months, only has atrovent inhaler, did multiple doses today, minimal relief, 84% on 4L when ems arrived, given 1 duoneb, 125mg  solu medrol, now 93-94% on 4L

## 2021-03-19 NOTE — ED Provider Notes (Addendum)
[facility]   CSN: [id number] Arrival date & time: 03/19/21  1503     History Chief Complaint  Patient presents with  . COPD    Candice Cooper is a 60 y.o. female with history of COPD, chronic respiratory failure on 4 L [place], HTN, tobacco use presents to ER by EMS from friend's house for evaluation of shortness of breath worsening over last 24 hr.  Reports chronic cough and sputum production that appears slightly worse than usually. It burns her chest and throat to cough.  Has associated runny hose, headache, lower back pain, nausea, vomiting. Febrile on arrival 102.1, she was not aware.  Patient has been using atrovent inhaler and oxygen at home but has been without her other medicines for 3 months due to lack of insurance.  Had COVID 1.5 years ago and received JJ vaccine. No sick contacts. Denies abdominal pain, changes in BM, dysuria, hematuria, urgency. She denies leg swelling, calf pain, hemoptysis. No history of DVT/PE, hormone therapy, recent surgery, prolonged immobilization. States she takes a few hits of a cigarette with every meal. Has had to cut back on smoking because her lungs can't take it anymore.    Additional information obtained from EMS.  On fire arrival patient was noted to be 84% on her 4 L [place]. Per EMS she has diminished lung sounds. She was given 125 mg solumedrol, atrovent, albuterol en route.   HPI     Past Medical History:  Diagnosis Date  . COPD (chronic obstructive pulmonary disease) [facility])     Patient Active Problem List   Diagnosis Date Noted  . Acute on chronic respiratory failure with hypoxia (HCC) 03/19/2021  . Esophageal obstruction due to food impaction   . Food impaction of esophagus     Past Surgical History:  Procedure Laterality Date  . CESAREAN SECTION    . ECTOPIC PREGNANCY SURGERY    . ESOPHAGOGASTRODUODENOSCOPY N/A 09/13/2020   Procedure: ESOPHAGOGASTRODUODENOSCOPY (EGD);  Surgeon: Napoleon Form, MD;  Location: [facility];  Service: Endoscopy;  Laterality: N/A;  . FOREIGN BODY REMOVAL  09/13/2020   Procedure: FOREIGN BODY REMOVAL;  Surgeon: Napoleon Form, MD;  Location: [facility];  Service: Endoscopy;;  . TUBAL LIGATION       OB History   No obstetric history on file.     Family History  Problem Relation Age of Onset  . Diabetes Brother     Social History   Tobacco Use  . Smoking status: Current Every Day Smoker  Substance Use Topics  . Alcohol use: Yes  . Drug use: Yes    Types: Marijuana    Comment: last use today     Home Medications Prior to Admission medications   Medication Sig Start Date End Date Taking? Authorizing Provider  albuterol (PROVENTIL) (2.5 MG/3ML) 0.083% nebulizer solution Inhale into the lungs. 06/15/20   [provider]  amLODipine (NORVASC) 5 MG tablet Take by mouth. 06/15/20   [provider]  cephALEXin (KEFLEX) 250 MG capsule Take 1 capsule (250 mg total) by mouth 4 (four) times daily. 03/22/16   Roxy Horseman, PA-C  ibuprofen (ADVIL,MOTRIN) 200 MG tablet Take 400-600 mg by mouth every 4 (four) hours as needed.    [provider]  lisinopril (ZESTRIL) 5 MG tablet Take by mouth. 06/15/20   [provider]  naproxen sodium (ANAPROX) 220 MG tablet Take 440 mg by mouth 2 (two) times daily as needed (pain).  [provider]  omeprazole (PRILOSEC) 20 MG capsule Take 1 capsule (20 mg total) by mouth daily. 09/13/20 10/13/20  Carroll Sage, PA-C  ondansetron (ZOFRAN) 4 MG tablet Take 1 tablet (4 mg total) by mouth every 6 (six) hours. 09/13/20   Carroll Sage, PA-C    Allergies    Patient has no known allergies.  Review of Systems   Review of Systems  Constitutional: Positive for fever.  HENT: Positive for congestion, rhinorrhea and sore throat.   Respiratory: Positive for cough and shortness of breath.   Cardiovascular: Positive for chest pain.  Gastrointestinal:  Positive for nausea and vomiting.  Musculoskeletal: Positive for back pain.  Neurological: Positive for headaches.  All other systems reviewed and are negative.   Physical Exam Updated Vital Signs BP 104/71   Pulse (!) 117   Temp 99 F (37.2 C) (Oral)   Resp (!) 31   SpO2 91%   Physical Exam Vitals and nursing note reviewed.  Constitutional:      General: She is not in acute distress.    Appearance: She is well-developed.     Comments: NAD.  HENT:     Head: Normocephalic and atraumatic.     Right Ear: External ear normal.     Left Ear: External ear normal.     Nose: Nose normal.     Mouth/Throat:     Pharynx: Posterior oropharyngeal erythema present.     Comments: Diffuse oropharyngeal and tonsillar erythema. No edema, exudates.  Eyes:     General: No scleral icterus.    Conjunctiva/sclera: Conjunctivae normal.  Cardiovascular:     Rate and Rhythm: Regular rhythm. Tachycardia present.     Heart sounds: Normal heart sounds.     Comments: HR in 120s, feels regular. No LE edema, calf tenderness  Pulmonary:     Effort: Pulmonary effort is normal.     Comments: On 4 L [place] Spo2 ~90%. Speaking in full sentences. No significant increased work of breathing. Diminished lungs throughout. No crackles or wheezing. Tachypnic  Abdominal:     Palpations: Abdomen is soft.     Tenderness: There is no abdominal tenderness.     Comments: No suprapubic or CVA tenderness   Musculoskeletal:        General: No deformity. Normal range of motion.     Cervical back: Normal range of motion and neck supple.  Skin:    General: Skin is warm and dry.     Capillary Refill: Capillary refill takes less than 2 seconds.  Neurological:     Mental Status: She is alert and oriented to person, place, and time.  Psychiatric:        Behavior: Behavior normal.        Thought Content: Thought content normal.        Judgment: Judgment normal.     ED Results / Procedures / Treatments   Labs (all labs  ordered are listed, but only abnormal results are displayed) Labs Reviewed  RESP PANEL BY RT-PCR (FLU A&B, COVID) ARPGX2 - Abnormal; Notable for the following components:      Result Value   Influenza A by PCR POSITIVE (*)    All other components within normal limits  BASIC METABOLIC PANEL - Abnormal; Notable for the following components:   Sodium 127 (*)    Potassium 3.2 (*)    Chloride 83 (*)    Glucose, Bld 121 (*)    Calcium 8.5 (*)    All other  components within normal limits  HEPATIC FUNCTION PANEL - Abnormal; Notable for the following components:   Albumin 3.4 (*)    AST 54 (*)    Indirect Bilirubin 0.2 (*)    All other components within normal limits  CBC WITH DIFFERENTIAL/PLATELET - Abnormal; Notable for the following components:   Lymphs Abs 0.4 (*)    All other components within normal limits  BRAIN NATRIURETIC PEPTIDE - Abnormal; Notable for the following components:   B Natriuretic Peptide 129.1 (*)    All other components within normal limits  URINALYSIS, ROUTINE W REFLEX MICROSCOPIC - Abnormal; Notable for the following components:   Ketones, ur 20 (*)    Protein, ur 100 (*)    All other components within normal limits  LACTIC ACID, PLASMA - Abnormal; Notable for the following components:   Lactic Acid, Venous 2.4 (*)    All other components within normal limits  LACTIC ACID, PLASMA - Abnormal; Notable for the following components:   Lactic Acid, Venous 3.0 (*)    All other components within normal limits  URINE CULTURE  CULTURE, BLOOD (ROUTINE X 2)  CULTURE, BLOOD (ROUTINE X 2)  BASIC METABOLIC PANEL  CBC  POC SARS CORONAVIRUS 2 AG -  ED  I-STAT VENOUS BLOOD GAS, ED    EKG EKG Interpretation  Date/Time:  Saturday Mar 19 2021 15:53:42 EDT Ventricular Rate:  127 PR Interval:  76 QRS Duration: 88 QT Interval:  321 QTC Calculation: 467 R Axis:   86 Text Interpretation: Sinus tachycardia Borderline repolarization abnormality Since last tracing Rate  faster Confirmed by Susy FrizzleSheldon, Charles [phone]) on 03/19/2021 3:57:02 PM   Radiology DG Chest Port 1 View  Result Date: 03/19/2021 CLINICAL DATA:  Shortness of breath. EXAM: PORTABLE CHEST 1 VIEW COMPARISON:  September 13, 2020 FINDINGS: The lungs are hyperinflated. There is no evidence of acute infiltrate, pleural effusion or pneumothorax. The heart size and mediastinal contours are within normal limits. Moderate to marked severity calcification of the aortic arch is noted. The visualized skeletal structures are unremarkable. IMPRESSION: No active cardiopulmonary disease. Electronically Signed   By: Aram Candelahaddeus  Houston M.D.   On: 03/19/2021 15:48    Procedures .Critical Care Performed by: Liberty HandyGibbons, Bellany Elbaum J, PA-C Authorized by: Liberty HandyGibbons, Remy Voiles J, PA-C   Critical care provider statement:    Critical care time (minutes):  45   Critical care was necessary to treat or prevent imminent or life-threatening deterioration of the following conditions:  Respiratory failure and sepsis   Critical care was time spent personally by me on the following activities:  Discussions with consultants, evaluation of patient's response to treatment, examination of patient, ordering and performing treatments and interventions, ordering and review of laboratory studies, ordering and review of radiographic studies, pulse oximetry, re-evaluation of patient's condition, obtaining history from patient or surrogate, review of old charts and development of treatment plan with patient or surrogate   I assumed direction of critical care for this patient from another provider in my specialty: no       Medications Ordered in ED Medications  enoxaparin (LOVENOX) injection 40 mg (has no administration in time range)  oseltamivir (TAMIFLU) capsule 75 mg (has no administration in time range)  acetaminophen (TYLENOL) tablet 650 mg (650 mg Oral Given 03/19/21 1618)  albuterol (PROVENTIL,VENTOLIN) solution continuous neb (10 mg/hr  Nebulization Given 03/19/21 1715)  magnesium sulfate IVPB 2 g 50 mL (0 g Intravenous Stopped 03/19/21 1721)  sodium chloride 0.9 % bolus 500 mL (0 mLs Intravenous Stopped  03/19/21 1744)  benzonatate (TESSALON) capsule 200 mg (200 mg Oral Given 03/19/21 1730)  oseltamivir (TAMIFLU) capsule 75 mg (75 mg Oral Given 03/19/21 1810)  ondansetron (ZOFRAN) injection 4 mg (4 mg Intravenous Given 03/19/21 1810)  sodium chloride 0.9 % bolus 1,000 mL (1,000 mLs Intravenous New Bag/Given 03/19/21 1829)  ibuprofen (ADVIL) tablet 600 mg (600 mg Oral Given 03/19/21 1829)    ED Course  I have reviewed the triage vital signs and the nursing notes.  Pertinent labs & imaging results that were available during my care of the patient were reviewed by me and considered in my medical decision making (see chart for details).  Clinical Course as of 03/19/21 1846  Sat Mar 19, 2021  1553 DG Chest [place] 1 View IMPRESSION: No active cardiopulmonary disease. [CG]  1720 Sodium(!): 127 [CG]  1720 Potassium(!): 3.2 [CG]  1720 Glucose(!): 121 [CG]  1720 WBC: 6.4 [CG]  1720 Lactic Acid, Venous(!!): 2.4 [CG]  1740 Influenza A By PCR(!): POSITIVE [CG]  1741 B Natriuretic Peptide(!): 129.1 [CG]    Clinical Course User Index [CG] Liberty Handy, PA-C   MDM Rules/Calculators/A&P                          EMR, triage and nursing notes reviewed  Obtained additional information/report from EMS  60 yo F here for shortness of breath, rhinorrhea, sore throat, headache, back pain, vomiting. H/o COPD on 4 L [place] and hypoxic at 84% while on her usual supplemental oxygen.   Febrile 102.1, tachypnic, tachycardic. Mentating well, no severe respiratory distress.  Diminished air sounds throughout. No signs of hypervolemia.  No calf tenderness.   Meets SIRS criteria given vitals.   Will start with labs, imaging, COVID test.   Will give cont duoneb, Mg, tylenol. Received atrovent, albuteorl, solumedrol en route by EMS   1840: Labs imaging  personally reviewed and interpreted  Labs reveal - COVID negative, Flu A positive. Lactic acid 2.4>3 after 500 cc NS.  Normal WBC. HD stable. Patient feels better, appears well. No respiratory distress. Stable on 5 L [place].  Tachycardia has improved.  Will add 1 L NS bolus, add VBG. Na 127, K 3.2. normal BNP  Imagine reveals - CXR unremarkable. EKG shows ST.  Patient given tamiflu, tylenol, motrin, zofran, Mg, continuous albuterol treatment, 1.5 L NS  Patient re-evaluated, opened up now with wheezing. Feels better.   Discussed with hospitalist who will admit for acute on chronic hypoxia 2/2 COPD exacerbation and influenza A. SIRS.    Final Clinical Impression(s) / ED Diagnoses Final diagnoses:  Hypoxia  Influenza A    Rx / DC Orders ED Discharge Orders    None         Jerrell Mylar 03/19/21 Landry Corporal, MD 03/19/21 2040

## 2021-03-19 NOTE — Progress Notes (Signed)
MD notified lactic acid 4.4, fluids increased from 75 to 125 per MD order. Will continue to monitor.

## 2021-03-20 ENCOUNTER — Encounter (HOSPITAL_COMMUNITY): Payer: Self-pay | Admitting: Family Medicine

## 2021-03-20 DIAGNOSIS — J9621 Acute and chronic respiratory failure with hypoxia: Secondary | ICD-10-CM

## 2021-03-20 LAB — LACTIC ACID, PLASMA
Lactic Acid, Venous: 2.4 mmol/L (ref 0.5–1.9)
Lactic Acid, Venous: 4.1 mmol/L (ref 0.5–1.9)

## 2021-03-20 LAB — BASIC METABOLIC PANEL
Anion gap: 10 (ref 5–15)
BUN: 12 mg/dL (ref 6–20)
CO2: 30 mmol/L (ref 22–32)
Calcium: 7.5 mg/dL — ABNORMAL LOW (ref 8.9–10.3)
Chloride: 93 mmol/L — ABNORMAL LOW (ref 98–111)
Creatinine, Ser: 0.71 mg/dL (ref 0.44–1.00)
GFR, Estimated: >60 mL/min (ref >60)
Glucose, Bld: 138 mg/dL — ABNORMAL HIGH (ref 70–99)
Potassium: 3.7 mmol/L (ref 3.5–5.1)
Sodium: 133 mmol/L — ABNORMAL LOW (ref 135–145)

## 2021-03-20 LAB — CBC
HCT: 42.5 % (ref 36.0–46.0)
Hemoglobin: 13.5 g/dL (ref 12.0–15.0)
MCH: 31.8 pg (ref 26.0–34.0)
MCHC: 31.8 g/dL (ref 30.0–36.0)
MCV: 100.2 fL — ABNORMAL HIGH (ref 80.0–100.0)
Platelets: 184 10*3/uL (ref 150–400)
RBC: 4.24 MIL/uL (ref 3.87–5.11)
RDW: 14.6 % (ref 11.5–15.5)
WBC: 7.1 10*3/uL (ref 4.0–10.5)
nRBC: 0 % (ref 0.0–0.2)

## 2021-03-20 LAB — URINE CULTURE: Culture: <10000 — AB

## 2021-03-20 MED ORDER — IPRATROPIUM-ALBUTEROL 0.5-2.5 (3) MG/3ML IN SOLN
3.0000 mL | Freq: Four times a day (QID) | RESPIRATORY_TRACT | Status: DC
  Administered 2021-03-20 – 2021-03-22 (×9): 3 mL via RESPIRATORY_TRACT
  Filled 2021-03-20 (×8): qty 3

## 2021-03-20 MED ORDER — SODIUM CHLORIDE 0.9 % IV SOLN: INTRAVENOUS | Status: DC

## 2021-03-20 MED ORDER — POLYETHYLENE GLYCOL 3350 17 G PO PACK
17.0000 g | PACK | Freq: Every day | ORAL | Status: DC
  Administered 2021-03-20 – 2021-03-22 (×3): 17 g via ORAL
  Filled 2021-03-20 (×3): qty 1

## 2021-03-20 MED ORDER — KETOROLAC TROMETHAMINE 15 MG/ML IJ SOLN
15.0000 mg | Freq: Once | INTRAMUSCULAR | Status: AC
  Administered 2021-03-20: 15 mg via INTRAVENOUS
  Filled 2021-03-20: qty 1

## 2021-03-20 MED ORDER — PROCHLORPERAZINE EDISYLATE 10 MG/2ML IJ SOLN
10.0000 mg | Freq: Once | INTRAMUSCULAR | Status: AC
  Administered 2021-03-20: 10 mg via INTRAVENOUS
  Filled 2021-03-20: qty 2

## 2021-03-20 MED ORDER — BISACODYL 10 MG RE SUPP
10.0000 mg | Freq: Once | RECTAL | Status: DC
  Filled 2021-03-20: qty 1

## 2021-03-20 MED ORDER — SENNOSIDES-DOCUSATE SODIUM 8.6-50 MG PO TABS
1.0000 | ORAL_TABLET | Freq: Two times a day (BID) | ORAL | Status: DC
  Administered 2021-03-20 – 2021-03-22 (×5): 1 via ORAL
  Filled 2021-03-20 (×5): qty 1

## 2021-03-20 MED ORDER — DIPHENHYDRAMINE HCL 50 MG/ML IJ SOLN
25.0000 mg | Freq: Once | INTRAMUSCULAR | Status: AC
  Administered 2021-03-20: 25 mg via INTRAVENOUS
  Filled 2021-03-20: qty 1

## 2021-03-20 NOTE — Progress Notes (Signed)
MD notified lactic acid 2.4 and no current iv fluids ordered.

## 2021-03-20 NOTE — Progress Notes (Signed)
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  She was also complaining of headache, back pain, nausea, vomiting.  On presentation she was febrile, tachycardic and hypoxic even on 4 L of oxygen on and had to be put on 5 L.  Found to have elevated lactate on presentation, low sodium, low potassium.  Influenza a was positive.  Started on IV steroids, bronchodilators, Tamiflu.   Assessment & Plan:   Principal Problem:   Acute on chronic respiratory failure with hypoxia (HCC) Active Problems:   Influenza A   Hyponatremia   Hypokalemia   Tobacco use   HTN (hypertension)   Acute on chronic hypoxic respiratory failure: Secondary to COPD exacerbation from flu.  She was hypoxic, wheezing on presentation.  She is on 4 L of oxygen at home at baseline. She feels much better today.  Continue steroids, Tamiflu, bronchodilators.  Encourage use of incentive spirometry, flutter valve.  Continue cough medication  Severe Sepsis secondary to influenza A: Presented with fever, tachycardia, tachypnea, lactic acidosis was present on admission.  Continue gentle IV fluids.  Hyponatremia: Improved with IV fluids  Hypokalemia: Supplemented and corrected  Hypertension: Currently blood pressure stable.  Continue monitoring on lisinopril.  Monitor blood pressure  Tobacco use: Continues to smoke 2 cigarettes a day.  Counseled for cessation.  Started on low-dose nicotine patch  Constipation: Continue bowel regimen           DVT prophylaxis: Lovenox Code Status: Full code Family Communication: None present at the bedside Status is: Inpatient  Remains inpatient appropriate because:Inpatient level of care appropriate due to severity of illness   Dispo: The patient is  from: Home              Anticipated d/c is to: Home              Patient currently is not medically stable to d/c.   Difficult to place patient No     Consultants: None  Procedures:None  Antimicrobials:  Anti-infectives (From admission, onward)   Start     Dose/Rate Route Frequency Ordered Stop   03/20/21 1000  oseltamivir (TAMIFLU) capsule 75 mg        75 mg Oral 2 times daily 03/19/21 1844 03/25/21 0959   03/19/21 1745  oseltamivir (TAMIFLU) capsule 75 mg        75 mg Oral  Once 03/19/21 1748 03/19/21 1810      Subjective:  Patient seen and examined the bedside this morning.  She was on 4 L of oxygen during my evaluation.  She feels much better today.  Continues to have some cough, denies any shortness of breath at rest.   Objective: Vitals:   03/20/21 0828 03/20/21 0851 03/20/21 0904 03/20/21 1200  BP:  (!) 149/67    Pulse:  82    Resp:  18    Temp:  97.9 F (36.6 C)    TempSrc:  Oral    SpO2: 94% 94% 93% 90%    Intake/Output Summary (Last 24 hours) at 03/20/2021 1203 Last data filed at 03/20/2021 0930 Gross per 24 hour  Intake 1526.3 ml  Output --  Net 1526.3 ml   There were no vitals filed for this visit.  Examination:  General exam: Appears calm  and comfortable ,Not in distress,average built, pleasant female HEENT:PERRL,Oral mucosa moist, Ear/Nose normal on gross exam Respiratory system: Bilateral mildly diminished air sounds no wheezes or crackles  Cardiovascular system: S1 & S2 heard, RRR. No JVD, murmurs, rubs, gallops or clicks. No pedal edema. Gastrointestinal system: Abdomen is nondistended, soft and nontender. No organomegaly or masses felt. Normal bowel sounds heard. Central nervous system: Alert and oriented. No focal neurological deficits. Extremities: No edema, no clubbing ,no cyanosis Skin: No rashes, lesions or ulcers,no icterus ,no pallor   Data Reviewed: I have personally reviewed following labs and imaging studies  CBC: Recent Labs   Lab 03/19/21 1614 03/20/21 0352  WBC 6.4 7.1  NEUTROABS 5.4  --   HGB 14.6 13.5  HCT 44.5 42.5  MCV 98.5 100.2*  PLT 225 184   Basic Metabolic Panel: Recent Labs  Lab 03/19/21 1614 03/19/21 2023 03/20/21 0352  NA 127* 132* 133*  K 3.2* 3.2* 3.7  CL 83* 89* 93*  CO2 29 29 30   GLUCOSE 121* 144* 138*  BUN 9 11 12   CREATININE 0.78 0.87 0.71  CALCIUM 8.5* 7.7* 7.5*   GFR: CrCl cannot be calculated (Unknown ideal weight.). Liver Function Tests: Recent Labs  Lab 03/19/21 1614  AST 54*  ALT 25  ALKPHOS 51  BILITOT 0.3  PROT 7.8  ALBUMIN 3.4*   No results for input(s): LIPASE, AMYLASE in the last 168 hours. No results for input(s): AMMONIA in the last 168 hours. Coagulation Profile: No results for input(s): INR, PROTIME in the last 168 hours. Cardiac Enzymes: No results for input(s): CKTOTAL, CKMB, CKMBINDEX, TROPONINI in the last 168 hours. BNP (last 3 results) No results for input(s): PROBNP in the last 8760 hours. HbA1C: No results for input(s): HGBA1C in the last 72 hours. CBG: No results for input(s): GLUCAP in the last 168 hours. Lipid Profile: No results for input(s): CHOL, HDL, LDLCALC, TRIG, CHOLHDL, LDLDIRECT in the last 72 hours. Thyroid Function Tests: No results for input(s): TSH, T4TOTAL, FREET4, T3FREE, THYROIDAB in the last 72 hours. Anemia Panel: No results for input(s): VITAMINB12, FOLATE, FERRITIN, TIBC, IRON, RETICCTPCT in the last 72 hours. Sepsis Labs: Recent Labs  Lab 03/19/21 1748 03/19/21 2023 03/19/21 2243 03/20/21 0352  LATICACIDVEN 3.0* 4.4* 3.2* 2.4*    Recent Results (from the past 240 hour(s))  Blood culture (routine x 2)     Status: None (Preliminary result)   Collection Time: 03/19/21  4:04 PM   Specimen: BLOOD  Result Value Ref [place] Status   Specimen Description   Final    BLOOD RIGHT ARM Performed at [facility], [address] [place], [place] [address]    Special Requests   Final    BOTTLES  DRAWN AEROBIC AND ANAEROBIC Blood Culture adequate volume Performed at [facility], [address], [place], [place] [address]    Culture   Final    NO GROWTH < 24 HOURS Performed at [facility], [address] [place], [place] [address]    Report Status PENDING  Incomplete  Blood culture (routine x 2)     Status: None (Preliminary result)   Collection Time: 03/19/21  4:14 PM   Specimen: BLOOD  Result Value Ref [place] Status   Specimen Description   Final    BLOOD RIGHT ANTECUBITAL Performed at [facility], [address], [place], [place] [address]    Special Requests   Final    BOTTLES DRAWN AEROBIC AND ANAEROBIC  Blood Culture adequate volume Performed at [facility], [address] [place], [place] [address]    Culture   Final    NO GROWTH < 24 HOURS Performed at [facility], [address], [place], [place] [address]    Report Status PENDING  Incomplete  Resp Panel by RT-PCR (Flu A&B, Covid) Nasopharyngeal Swab     Status: Abnormal   Collection Time: 03/19/21  4:18 PM   Specimen: Nasopharyngeal Swab; Nasopharyngeal(NP) swabs in vial transport medium  Result Value Ref [place] Status   SARS Coronavirus 2 by RT PCR NEGATIVE NEGATIVE Final    Comment: (NOTE) SARS-CoV-2 target nucleic acids are NOT DETECTED.  The SARS-CoV-2 RNA is generally detectable in upper respiratory specimens during the acute phase of infection. The lowest concentration of SARS-CoV-2 viral copies this assay can detect is 138 copies/mL. A negative result does not preclude SARS-Cov-2 infection and should not be used as the sole basis for treatment or other patient management decisions. A negative result may occur with  improper specimen collection/handling, submission of specimen other than nasopharyngeal swab, presence of viral mutation(s) within the areas targeted by this assay, and inadequate number of viral copies(<138  copies/mL). A negative result must be combined with clinical observations, patient history, and epidemiological information. The expected result is Negative.  Fact Sheet for Patients:  BloggerCourse.com  Fact Sheet for Healthcare Providers:  SeriousBroker.it  This test is no t yet approved or cleared by the [place] FDA and  has been authorized for detection and/or diagnosis of SARS-CoV-2 by FDA under an Emergency Use Authorization (EUA). This EUA will remain  in effect (meaning this test can be used) for the duration of the COVID-19 declaration under Section 564(b)(1) of the Act, 21 U.S.C.section 360bbb-3(b)(1), unless the authorization is terminated  or revoked sooner.       Influenza A by PCR POSITIVE (A) NEGATIVE Final   Influenza B by PCR NEGATIVE NEGATIVE Final    Comment: (NOTE) The Xpert Xpress SARS-CoV-2/FLU/RSV plus assay is intended as an aid in the diagnosis of influenza from Nasopharyngeal swab specimens and should not be used as a sole basis for treatment. Nasal washings and aspirates are unacceptable for Xpert Xpress SARS-CoV-2/FLU/RSV testing.  Fact Sheet for Patients: BloggerCourse.com  Fact Sheet for Healthcare Providers: SeriousBroker.it  This test is not yet approved or cleared by the [place] FDA and has been authorized for detection and/or diagnosis of SARS-CoV-2 by FDA under an Emergency Use Authorization (EUA). This EUA will remain in effect (meaning this test can be used) for the duration of the COVID-19 declaration under Section 564(b)(1) of the Act, 21 U.S.C. section 360bbb-3(b)(1), unless the authorization is terminated or revoked.  Performed at [facility], [address] [place], [place] [address]          Radiology Studies: [facility] Chest Port 1 View  Result Date: 03/19/2021 CLINICAL DATA:  Shortness of breath.  EXAM: PORTABLE CHEST 1 VIEW COMPARISON:  September 13, 2020 FINDINGS: The lungs are hyperinflated. There is no evidence of acute infiltrate, pleural effusion or pneumothorax. The heart size and mediastinal contours are within normal limits. Moderate to marked severity calcification of the aortic arch is noted. The visualized skeletal structures are unremarkable. IMPRESSION: No active cardiopulmonary disease. Electronically Signed   By: Aram Candela M.D.   On: 03/19/2021 15:48        Scheduled Meds: . amLODipine  5 mg Oral Daily  . bisacodyl  10 mg  Rectal Once  . enoxaparin (LOVENOX) injection  40 mg Subcutaneous Q24H  . fluticasone furoate-vilanterol  1 puff Inhalation Daily  . ipratropium-albuterol  3 mL Nebulization Q4H  . lisinopril  5 mg Oral Daily  . methylPREDNISolone (SOLU-MEDROL) injection  60 mg Intravenous Daily  . nicotine  7 mg Transdermal Daily  . oseltamivir  75 mg Oral BID  . polyethylene glycol  17 g Oral Daily  . senna-docusate  1 tablet Oral BID   Continuous Infusions: . sodium chloride 75 mL/hr at 03/20/21 1140     LOS: 1 day    Time spent: 35 mins.More than 50% of that time was spent in counseling and/or coordination of care.      Burnadette Pop, MD [facility] P5/06/2021, 12:03 PM

## 2021-03-21 LAB — LACTIC ACID, PLASMA: Lactic Acid, Venous: 2 mmol/L (ref 0.5–1.9)

## 2021-03-21 MED ORDER — MAGNESIUM HYDROXIDE 400 MG/5ML PO SUSP
15.0000 mL | Freq: Every day | ORAL | Status: DC
  Administered 2021-03-21 – 2021-03-22 (×2): 15 mL via ORAL
  Filled 2021-03-21 (×2): qty 30

## 2021-03-21 MED ORDER — PREDNISONE 20 MG PO TABS
40.0000 mg | ORAL_TABLET | Freq: Every day | ORAL | Status: DC
  Administered 2021-03-21 – 2021-03-22 (×2): 40 mg via ORAL
  Filled 2021-03-21 (×2): qty 2

## 2021-03-21 MED ORDER — PANTOPRAZOLE SODIUM 40 MG PO TBEC
40.0000 mg | DELAYED_RELEASE_TABLET | Freq: Every day | ORAL | Status: DC
  Administered 2021-03-21 – 2021-03-22 (×2): 40 mg via ORAL
  Filled 2021-03-21 (×2): qty 1

## 2021-03-21 MED ORDER — BUTALBITAL-APAP-CAFFEINE 50-325-40 MG PO TABS
1.0000 | ORAL_TABLET | ORAL | Status: DC | PRN
  Filled 2021-03-21: qty 1

## 2021-03-21 NOTE — Progress Notes (Signed)
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  She was also complaining of headache, back pain, nausea, vomiting.  On presentation she was febrile, tachycardic and hypoxic even on 4 L of oxygen on and had to be put on 5 L.  Found to have elevated lactate on presentation, low sodium, low potassium.  Influenza a was positive.  Started on IV steroids, bronchodilators, Tamiflu with much improvement.  Plan for discharge tomorrow to home.  PT consulted   Assessment & Plan:   Principal Problem:   Acute on chronic respiratory failure with hypoxia (HCC) Active Problems:   Influenza A   Hyponatremia   Hypokalemia   Tobacco use   HTN (hypertension)   Acute on chronic hypoxic respiratory failure: Secondary to COPD exacerbation from flu.  She was hypoxic, wheezing on presentation.  She is on 4 L of oxygen at home at baseline. She feels much better today.  Continue steroids, Tamiflu, bronchodilators.  Encourage use of incentive spirometry, flutter valve.  Continue cough medication.  Steroids changed to oral, she does not have any wheezes today  Severe Sepsis secondary to influenza A: Presented with fever, tachycardia, tachypnea, lactic acidosis was present on admission.  IV fluids discontinued  Hyponatremia: Improved with IV fluids  Hypokalemia: Supplemented and corrected  Hypertension: Currently blood pressure stable.  Continue monitoring on lisinopril.  Monitor blood pressure  Tobacco use: Continues to smoke 2 cigarettes a day.  Counseled for cessation.  Started on low-dose nicotine patch  Constipation: Continue bowel regimen  Debility/deconditioning: PT consulted  Headache: Given few doses of migraine cocktail.  Continue Tylenol/Fioricet            DVT prophylaxis: Lovenox Code Status: Full code Family Communication: None present at the bedside Status is: Inpatient  Remains inpatient appropriate because:Inpatient level of care appropriate due to severity of illness   Dispo: The patient is from: Home              Anticipated d/c is to: Home              Patient currently is not medically stable to d/c.   Difficult to place patient No     Consultants: None  Procedures:None  Antimicrobials:  Anti-infectives (From admission, onward)   Start     Dose/Rate Route Frequency Ordered Stop   03/20/21 1000  oseltamivir (TAMIFLU) capsule 75 mg        75 mg Oral 2 times daily 03/19/21 1844 03/25/21 0959   03/19/21 1745  oseltamivir (TAMIFLU) capsule 75 mg        75 mg Oral  Once 03/19/21 1748 03/19/21 1810      Subjective:   Patient seen and examined at the bedside this morning.  Complains of headache today.  Breathing is better than yesterday.  Does not feel ready to go home    Objective: Vitals:   03/21/21 0519 03/21/21 0756 03/21/21 0759 03/21/21 1018  BP: 138/64   126/72  Pulse: 78     Resp: 18     Temp: 98.2 F (36.8 C)     TempSrc: Oral     SpO2: 99% 96% 96%     Intake/Output Summary (Last 24 hours) at 03/21/2021 1225 Last data filed at 03/21/2021 1000 Gross per  24 hour  Intake 2215.91 ml  Output 300 ml  Net 1915.91 ml   There were no vitals filed for this visit.  Examination: General exam: Overall comfortable, not in distress HEENT: PERRL Respiratory system:  no wheezes or crackles, diminished air sounds bilaterally Cardiovascular system: S1 & S2 heard, RRR.  Gastrointestinal system: Abdomen is nondistended, soft and nontender. Central nervous system: Alert and oriented Extremities: No edema, no clubbing ,no cyanosis Skin: No rashes, no ulcers,no icterus    Data Reviewed: I have personally reviewed following labs and imaging studies  CBC: Recent Labs  Lab 03/19/21 1614 03/20/21 0352  WBC 6.4  7.1  NEUTROABS 5.4  --   HGB 14.6 13.5  HCT 44.5 42.5  MCV 98.5 100.2*  PLT 225 184   Basic Metabolic Panel: Recent Labs  Lab 03/19/21 1614 03/19/21 2023 03/20/21 0352  NA 127* 132* 133*  K 3.2* 3.2* 3.7  CL 83* 89* 93*  CO2 29 29 30   GLUCOSE 121* 144* 138*  BUN 9 11 12   CREATININE 0.78 0.87 0.71  CALCIUM 8.5* 7.7* 7.5*   GFR: CrCl cannot be calculated (Unknown ideal weight.). Liver Function Tests: Recent Labs  Lab 03/19/21 1614  AST 54*  ALT 25  ALKPHOS 51  BILITOT 0.3  PROT 7.8  ALBUMIN 3.4*   No results for input(s): LIPASE, AMYLASE in the last 168 hours. No results for input(s): AMMONIA in the last 168 hours. Coagulation Profile: No results for input(s): INR, PROTIME in the last 168 hours. Cardiac Enzymes: No results for input(s): CKTOTAL, CKMB, CKMBINDEX, TROPONINI in the last 168 hours. BNP (last 3 results) No results for input(s): PROBNP in the last 8760 hours. HbA1C: No results for input(s): HGBA1C in the last 72 hours. CBG: No results for input(s): GLUCAP in the last 168 hours. Lipid Profile: No results for input(s): CHOL, HDL, LDLCALC, TRIG, CHOLHDL, LDLDIRECT in the last 72 hours. Thyroid Function Tests: No results for input(s): TSH, T4TOTAL, FREET4, T3FREE, THYROIDAB in the last 72 hours. Anemia Panel: No results for input(s): VITAMINB12, FOLATE, FERRITIN, TIBC, IRON, RETICCTPCT in the last 72 hours. Sepsis Labs: Recent Labs  Lab 03/19/21 2243 03/20/21 0352 03/20/21 1314 03/21/21 0326  LATICACIDVEN 3.2* 2.4* 4.1* 2.0*    Recent Results (from the past 240 hour(s))  Blood culture (routine x 2)     Status: None (Preliminary result)   Collection Time: 03/19/21  4:04 PM   Specimen: BLOOD  Result Value Ref [place] Status   Specimen Description   Final    BLOOD RIGHT ARM Performed at [facility], [address] [place], [place] [address]    Special Requests   Final    BOTTLES DRAWN AEROBIC AND ANAEROBIC Blood Culture  adequate volume Performed at [facility], [address] [place], [place] [address]    Culture   Final    NO GROWTH 2 DAYS Performed at [facility], [address] [place], [place] [address]    Report Status PENDING  Incomplete  Blood culture (routine x 2)     Status: None (Preliminary result)   Collection Time: 03/19/21  4:14 PM   Specimen: BLOOD  Result Value Ref [place] Status   Specimen Description   Final    BLOOD RIGHT ANTECUBITAL Performed at [facility], [address] [place], [place] [address]    Special Requests   Final    BOTTLES DRAWN AEROBIC AND ANAEROBIC Blood Culture adequate volume Performed at [facility], [address] [place], [place] [address]    Culture   Final    NO GROWTH 2 DAYS Performed at [facility], [address] [place], [place] [address]    Report Status PENDING  Incomplete  Resp Panel by RT-PCR (Flu A&B, Covid) Nasopharyngeal Swab     Status: Abnormal   Collection Time: 03/19/21  4:18 PM   Specimen: Nasopharyngeal Swab; Nasopharyngeal(NP) swabs in vial transport medium  Result Value Ref [place] Status   SARS Coronavirus 2 by RT PCR NEGATIVE NEGATIVE Final    Comment: (NOTE) SARS-CoV-2 target nucleic acids are NOT DETECTED.  The SARS-CoV-2 RNA is generally detectable in upper respiratory specimens during the acute phase of infection. The lowest concentration of SARS-CoV-2 viral copies this assay can detect is 138 copies/mL. A negative result does not preclude SARS-Cov-2 infection and should not be used as the sole basis for treatment or other patient management decisions. A negative result may occur with  improper specimen collection/handling, submission of specimen other than nasopharyngeal swab, presence of viral mutation(s) within the areas targeted by this assay, and inadequate number of viral copies(<138 copies/mL). A negative result must be combined  with clinical observations, patient history, and epidemiological information. The expected result is Negative.  Fact Sheet for Patients:  BloggerCourse.com  Fact Sheet for Healthcare Providers:  SeriousBroker.it  This test is no t yet approved or cleared by the [place] FDA and  has been authorized for detection and/or diagnosis of SARS-CoV-2 by FDA under an Emergency Use Authorization (EUA). This EUA will remain  in effect (meaning this test can be used) for the duration of the COVID-19 declaration under Section 564(b)(1) of the Act, 21 U.S.C.section 360bbb-3(b)(1), unless the authorization is terminated  or revoked sooner.       Influenza A by PCR POSITIVE (A) NEGATIVE Final   Influenza B by PCR NEGATIVE NEGATIVE Final    Comment: (NOTE) The Xpert Xpress SARS-CoV-2/FLU/RSV plus assay is intended as an aid in the diagnosis of influenza from Nasopharyngeal swab specimens and should not be used as a sole basis for treatment. Nasal washings and aspirates are unacceptable for Xpert Xpress SARS-CoV-2/FLU/RSV testing.  Fact Sheet for Patients: BloggerCourse.com  Fact Sheet for Healthcare Providers: SeriousBroker.it  This test is not yet approved or cleared by the [place] FDA and has been authorized for detection and/or diagnosis of SARS-CoV-2 by FDA under an Emergency Use Authorization (EUA). This EUA will remain in effect (meaning this test can be used) for the duration of the COVID-19 declaration under Section 564(b)(1) of the Act, 21 U.S.C. section 360bbb-3(b)(1), unless the authorization is terminated or revoked.  Performed at [facility], [address], [place], [place] [address]   Urine culture     Status: Abnormal   Collection Time: 03/19/21  5:07 PM   Specimen: Urine, Random  Result Value Ref Range Status   Specimen Description   Final     URINE, RANDOM Performed at [facility], [address] [place], [place] [address]    Special Requests   Final    NONE Performed at [facility], [address] [place], [place] [address]    Culture (A)  Final    <10,000 COLONIES/mL INSIGNIFICANT GROWTH Performed at [facility], [address], [place], [place] [address]    Report Status 03/20/2021 FINAL  Final         Radiology Studies: [facility] Chest [place]  1 View  Result Date: 03/19/2021 CLINICAL DATA:  Shortness of breath. EXAM: PORTABLE CHEST 1 VIEW COMPARISON:  September 13, 2020 FINDINGS: The lungs are hyperinflated. There is no evidence of acute infiltrate, pleural effusion or pneumothorax. The heart size and mediastinal contours are within normal limits. Moderate to marked severity calcification of the aortic arch is noted. The visualized skeletal structures are unremarkable. IMPRESSION: No active cardiopulmonary disease. Electronically Signed   By: Aram Candela M.D.   On: 03/19/2021 15:48        Scheduled Meds: . amLODipine  5 mg Oral Daily  . bisacodyl  10 mg Rectal Once  . enoxaparin (LOVENOX) injection  40 mg Subcutaneous Q24H  . fluticasone furoate-vilanterol  1 puff Inhalation Daily  . ipratropium-albuterol  3 mL Nebulization QID  . lisinopril  5 mg Oral Daily  . magnesium hydroxide  15 mL Oral Daily  . nicotine  7 mg Transdermal Daily  . oseltamivir  75 mg Oral BID  . pantoprazole  40 mg Oral Daily  . polyethylene glycol  17 g Oral Daily  . predniSONE  40 mg Oral Q breakfast  . senna-docusate  1 tablet Oral BID   Continuous Infusions:    LOS: 2 days    Time spent: 35 mins.More than 50% of that time was spent in counseling and/or coordination of care.      Burnadette Pop, MD [facility] P5/07/2021, 12:25 PM

## 2021-03-21 NOTE — Evaluation (Signed)
Physical Therapy Evaluation Patient Details Name: Candice Cooper MRN: [id number] DOB: Dec 28, 1960 Today's Date: 03/21/2021   History of Present Illness  60 year old female admitted for Acute on chronic hypoxic respiratory failure: Secondary to COPD exacerbation from flu. PMHx: COPD on 4 L of oxygen at home, bipolar disorder  Clinical Impression  Patient evaluated by Physical Therapy with no further acute PT needs identified. All education has been completed and the patient has no further questions. Pt ambulated in hallway and SPO2 92% on baseline 4L O2 [place].  Pt aware she will need to take rest breaks as needed during physical activities at home. See below for any follow-up Physical Therapy or equipment needs. PT is signing off. Thank you for this referral.     Follow Up Recommendations No PT follow up    Equipment Recommendations  None recommended by PT    Recommendations for Other Services       Precautions / Restrictions Precautions Precautions: Other (comment) Precaution Comments: monitor sats Restrictions Weight Bearing Restrictions: No      Mobility  Bed Mobility Overal bed mobility: Needs Assistance Bed Mobility: Supine to Sit;Sit to Supine     Supine to sit: Supervision Sit to supine: Supervision   General bed mobility comments: supervision only for lines    Transfers Overall transfer level: Needs assistance Equipment used: None Transfers: Sit to/from Stand;Stand Pivot Transfers Sit to Stand: Supervision Stand pivot transfers: Supervision       General transfer comment: supervision for lines  Ambulation/Gait Ambulation/Gait assistance: Supervision;Min guard Gait Distance (Feet): 360 Feet Assistive device: None Gait Pattern/deviations: Step-through pattern;Decreased stride length     General Gait Details: slow but steady pace, pt taking occassional standing rest breaks due to dyspnea and fatigue, pt reports LE pain with ambulating only (improves at rest);  ambulated on 4L O2 [place] and SPO2 92%  Stairs            Wheelchair Mobility    Modified Rankin (Stroke Patients Only)       Balance                                             Pertinent Vitals/Pain Pain Assessment: Faces Faces Pain Scale: Hurts little more Pain Location: LEs with ambulating Pain Descriptors / Indicators: Throbbing Pain Intervention(s): Monitored during session;Repositioned    Home Living Family/patient expects to be discharged to:: Private residence Living Arrangements: Alone Available Help at Discharge: Family;Available PRN/intermittently Type of Home: House Home Access: Stairs to enter   [employer] of Steps: a few   Home Equipment: None      Prior Function Level of Independence: Independent               Hand Dominance        Extremity/Trunk Assessment        Lower Extremity Assessment Lower Extremity Assessment: Generalized weakness    Cervical / Trunk Assessment Cervical / Trunk Assessment: Normal  Communication   Communication: No difficulties  Cognition Arousal/Alertness: Awake/alert Behavior During Therapy: WFL for tasks assessed/performed Overall Cognitive Status: Within Functional Limits for tasks assessed                                 General Comments: appropriate, reports new dementia?      General Comments  Exercises     Assessment/Plan    PT Assessment Patent does not need any further PT services  PT Problem List Decreased mobility;Decreased activity tolerance;Cardiopulmonary status limiting activity       PT Treatment Interventions      PT Goals (Current goals can be found in the Care Plan section)  Acute Rehab PT Goals PT Goal Formulation: All assessment and education complete, DC therapy    Frequency     Barriers to discharge        Co-evaluation               AM-PAC PT "6 Clicks" Mobility  Outcome Measure Help needed turning  from your back to your side while in a flat bed without using bedrails?: None Help needed moving from lying on your back to sitting on the side of a flat bed without using bedrails?: None Help needed moving to and from a bed to a chair (including a wheelchair)?: None Help needed standing up from a chair using your arms (e.g., wheelchair or bedside chair)?: A Little Help needed to walk in hospital room?: A Little Help needed climbing 3-5 steps with a railing? : A Little 6 Click Score: 21    End of Session Equipment Utilized During Treatment: Gait belt Activity Tolerance: Patient tolerated treatment well Patient left: in bed;with call bell/phone within reach Nurse Communication: Mobility status PT Visit Diagnosis: Difficulty in walking, not elsewhere classified (R26.2)    Time: 9675-9163 PT Time Calculation (min) (ACUTE ONLY): 14 min   Charges:   PT Evaluation $PT Eval Low Complexity: 1 Low         Kati PT, DPT Acute Rehabilitation Services Pager: [phone] Office: [phone]  Sarajane Jews 03/21/2021, 12:31 PM

## 2021-03-22 MED ORDER — POLYETHYLENE GLYCOL 3350 17 G PO PACK: 17.0000 g | PACK | Freq: Every day | ORAL | 0 refills | Status: AC | PRN

## 2021-03-22 MED ORDER — ALBUTEROL SULFATE HFA 108 (90 BASE) MCG/ACT IN AERS
2.0000 | INHALATION_SPRAY | Freq: Four times a day (QID) | RESPIRATORY_TRACT | Status: DC | PRN
  Administered 2021-03-22: 2 via RESPIRATORY_TRACT
  Filled 2021-03-22: qty 6.7

## 2021-03-22 MED ORDER — HYDROCOD POLST-CPM POLST ER 10-8 MG/5ML PO SUER: 5.0000 mL | Freq: Two times a day (BID) | ORAL | 0 refills | Status: AC | PRN

## 2021-03-22 MED ORDER — PREDNISONE 20 MG PO TABS: 40.0000 mg | ORAL_TABLET | Freq: Every day | ORAL | 0 refills | Status: AC

## 2021-03-22 MED ORDER — PANTOPRAZOLE SODIUM 40 MG PO TBEC: 40.0000 mg | DELAYED_RELEASE_TABLET | Freq: Every day | ORAL | 0 refills | Status: AC

## 2021-03-22 MED ORDER — OSELTAMIVIR PHOSPHATE 75 MG PO CAPS: 75.0000 mg | ORAL_CAPSULE | Freq: Two times a day (BID) | ORAL | 0 refills | Status: AC

## 2021-03-22 MED ORDER — SENNOSIDES-DOCUSATE SODIUM 8.6-50 MG PO TABS: 1.0000 | ORAL_TABLET | Freq: Two times a day (BID) | ORAL | 0 refills | Status: AC

## 2021-03-22 NOTE — Discharge Summary (Signed)
Physician Discharge Summary  Candice Cooper ZOX:[id number] DOB: 1961/03/05 DOA: 03/19/2021  PCP: Pcp, No  Admit date: 03/19/2021 Discharge date: 03/22/2021  Admitted From: Home Disposition:  Home  Discharge Condition:Stable CODE STATUS:FULL Diet recommendation: Heart Healthy   Brief/Interim Summary:  Patient is a 60 year old female with history of COPD on 4 L of oxygen at home, bipolar disorder who presents with dyspnea, wheezing.  She was also complaining of headache, back pain, nausea, vomiting.  On presentation she was febrile, tachycardic and hypoxic even on 4 L of oxygen on and had to be put on 5 L.  Found to have elevated lactate on presentation, low sodium, low potassium.  Influenza a was positive.  Started on IV steroids, bronchodilators, Tamiflu with much improvement.  Plan for discharge tomorrow to home with prednisone.  Following  problems were addressed during her hospitalization :   Acute on chronic hypoxic respiratory failure: Secondary to COPD exacerbation from flu.  She was hypoxic, wheezing on presentation.  She is on 4 L of oxygen at home at baseline. She feels much better today.    Steroids changed to oral, she does not have any wheezes today.  Severe Sepsis secondary to influenza A: Presented with fever, tachycardia, tachypnea, lactic acidosis was present on admission.  IV fluids discontinued.  On Tamiflu  Hyponatremia: Improved with IV fluids  Hypokalemia: Supplemented and corrected  Hypertension: Currently blood pressure stable.  Continue home medications  Tobacco use: Continues to smoke 2 cigarettes a day.  Counseled for cessation.    Constipation: Continue bowel regimen  Debility/deconditioning: PT consulted on follow-up recommended  Headache: Given few doses of migraine cocktail.  Resolved   Discharge Diagnoses:  Principal Problem:   Acute on chronic respiratory failure with hypoxia (HCC) Active Problems:   Influenza A   Hyponatremia    Hypokalemia   Tobacco use   HTN (hypertension)    Discharge Instructions  Discharge Instructions    Diet - low sodium heart healthy   Complete by: As directed    Discharge instructions   Complete by: As directed    1)Please take prescribed medications as instructed 2)Follow up with your PCP in 1 to 2 weeks.   Increase activity slowly   Complete by: As directed      Allergies as of 03/22/2021      Reactions   Levofloxacin Anaphylaxis      Medication List    STOP taking these medications   cephALEXin 250 MG capsule Commonly known as: KEFLEX   omeprazole 20 MG capsule Commonly known as: PRILOSEC     TAKE these medications   acetaminophen 500 MG tablet Commonly known as: TYLENOL Take 500 mg by mouth every 6 (six) hours as needed for moderate pain.   albuterol (2.5 MG/3ML) 0.083% nebulizer solution Commonly known as: PROVENTIL Inhale 2.5 mg into the lungs every 6 (six) hours as needed for wheezing or shortness of breath.   amLODipine 5 MG tablet Commonly known as: NORVASC Take 5 mg by mouth daily.   chlorpheniramine-HYDROcodone 10-8 MG/5ML Suer Commonly known as: TUSSIONEX Take 5 mLs by mouth every 12 (twelve) hours as needed for cough.   fluticasone furoate-vilanterol 100-25 MCG/INH Aepb Commonly known as: BREO ELLIPTA Inhale 1 puff into the lungs daily.   ipratropium-albuterol 0.5-2.5 (3) MG/3ML Soln Commonly known as: DUONEB Inhale 3 mLs into the lungs every 6 (six) hours as needed (wheezing).   lisinopril 5 MG tablet Commonly known as: ZESTRIL Take 5 mg by mouth daily.   ondansetron  4 MG tablet Commonly known as: ZOFRAN Take 1 tablet (4 mg total) by mouth every 6 (six) hours.   oseltamivir 75 MG capsule Commonly known as: TAMIFLU Take 1 capsule (75 mg total) by mouth 2 (two) times daily for 3 days.   pantoprazole 40 MG tablet Commonly known as: PROTONIX Take 1 tablet (40 mg total) by mouth daily. Start taking on: Mar 23, 2021   polyethylene  glycol 17 g packet Commonly known as: MIRALAX / GLYCOLAX Take 17 g by mouth daily as needed.   predniSONE 20 MG tablet Commonly known as: DELTASONE Take 2 tablets (40 mg total) by mouth daily with breakfast for 3 days. Start taking on: Mar 23, 2021   senna-docusate 8.6-50 MG tablet Commonly known as: Senokot-S Take 1 tablet by mouth 2 (two) times daily for 5 days.       Follow-up Information    [facility] Follow up on 04/13/2021.   Why: Patient has an appointment at 2:30 on Wednesday April 13, 2021 at [facility]. Contact information: [address] [place] [address] [phone]             Allergies  Allergen Reactions  . Levofloxacin Anaphylaxis    Consultations:  None   Procedures/Studies: DG Chest Port 1 View  Result Date: 03/19/2021 CLINICAL DATA:  Shortness of breath. EXAM: PORTABLE CHEST 1 VIEW COMPARISON:  September 13, 2020 FINDINGS: The lungs are hyperinflated. There is no evidence of acute infiltrate, pleural effusion or pneumothorax. The heart size and mediastinal contours are within normal limits. Moderate to marked severity calcification of the aortic arch is noted. The visualized skeletal structures are unremarkable. IMPRESSION: No active cardiopulmonary disease. Electronically Signed   By: Aram Candela M.D.   On: 03/19/2021 15:48       Subjective:  Patient seen and examined at bedside this morning.  Medically stable for discharge   Discharge Exam: Vitals:   03/22/21 0728 03/22/21 0910  BP:  (!) 147/72  Pulse:    Resp:    Temp:    SpO2: 98%    Vitals:   03/21/21 2149 03/22/21 0358 03/22/21 0728 03/22/21 0910  BP: 137/76 (!) 149/72  (!) 147/72  Pulse: 88     Resp: 15 17    Temp: 97.8 F (36.6 C) 97.8 F (36.6 C)    TempSrc: Oral Oral    SpO2: 98% 97% 98%     General: Pt is alert, awake, not in acute distress Cardiovascular: RRR, S1/S2 +, no rubs,  no gallops Respiratory: CTA bilaterally, no wheezing, no rhonchi Abdominal: Soft, NT, ND, bowel sounds + Extremities: no edema, no cyanosis    The results of significant diagnostics from this hospitalization (including imaging, microbiology, ancillary and laboratory) are listed below for reference.     Microbiology: Recent Results (from the past 240 hour(s))  Blood culture (routine x 2)     Status: None (Preliminary result)   Collection Time: 03/19/21  4:04 PM   Specimen: BLOOD  Result Value Ref Range Status   Specimen Description   Final    BLOOD RIGHT ARM Performed at [facility], [address], [place], [place] [address]    Special Requests   Final    BOTTLES DRAWN AEROBIC AND ANAEROBIC Blood Culture adequate volume Performed at [facility], [address] [place], [place] [address]    Culture   Final    NO GROWTH 3  DAYS Performed at [facility], [address] [place], [place] [address]    Report Status PENDING  Incomplete  Blood culture (routine x 2)     Status: None (Preliminary result)   Collection Time: 03/19/21  4:14 PM   Specimen: BLOOD  Result Value Ref Range Status   Specimen Description   Final    BLOOD RIGHT ANTECUBITAL Performed at [facility], [address], [place], [place] [address]    Special Requests   Final    BOTTLES DRAWN AEROBIC AND ANAEROBIC Blood Culture adequate volume Performed at [facility], [address], [place], [place] [address]    Culture   Final    NO GROWTH 3 DAYS Performed at [facility], [address] [place], [place] [address]    Report Status PENDING  Incomplete  Resp Panel by RT-PCR (Flu A&B, Covid) Nasopharyngeal Swab     Status: Abnormal   Collection Time: 03/19/21  4:18 PM   Specimen: Nasopharyngeal Swab; Nasopharyngeal(NP) swabs in vial transport medium  Result Value Ref Range Status   SARS Coronavirus 2 by RT PCR NEGATIVE  NEGATIVE Final    Comment: (NOTE) SARS-CoV-2 target nucleic acids are NOT DETECTED.  The SARS-CoV-2 RNA is generally detectable in upper respiratory specimens during the acute phase of infection. The lowest concentration of SARS-CoV-2 viral copies this assay can detect is 138 copies/mL. A negative result does not preclude SARS-Cov-2 infection and should not be used as the sole basis for treatment or other patient management decisions. A negative result may occur with  improper specimen collection/handling, submission of specimen other than nasopharyngeal swab, presence of viral mutation(s) within the areas targeted by this assay, and inadequate number of viral copies(<138 copies/mL). A negative result must be combined with clinical observations, patient history, and epidemiological information. The expected result is Negative.  Fact Sheet for Patients:  BloggerCourse.com  Fact Sheet for Healthcare Providers:  SeriousBroker.it  This test is no t yet approved or cleared by the [place] FDA and  has been authorized for detection and/or diagnosis of SARS-CoV-2 by FDA under an Emergency Use Authorization (EUA). This EUA will remain  in effect (meaning this test can be used) for the duration of the COVID-19 declaration under Section 564(b)(1) of the Act, 21 U.S.C.section 360bbb-3(b)(1), unless the authorization is terminated  or revoked sooner.       Influenza A by PCR POSITIVE (A) NEGATIVE Final   Influenza B by PCR NEGATIVE NEGATIVE Final    Comment: (NOTE) The Xpert Xpress SARS-CoV-2/FLU/RSV plus assay is intended as an aid in the diagnosis of influenza from Nasopharyngeal swab specimens and should not be used as a sole basis for treatment. Nasal washings and aspirates are unacceptable for Xpert Xpress SARS-CoV-2/FLU/RSV testing.  Fact Sheet for Patients: BloggerCourse.com  Fact Sheet for  Healthcare Providers: SeriousBroker.it  This test is not yet approved or cleared by the [place] FDA and has been authorized for detection and/or diagnosis of SARS-CoV-2 by FDA under an Emergency Use Authorization (EUA). This EUA will remain in effect (meaning this test can be used) for the duration of the COVID-19 declaration under Section 564(b)(1) of the Act, 21 U.S.C. section 360bbb-3(b)(1), unless the authorization is terminated or revoked.  Performed at [facility], [address], [place], [place] [address]   Urine culture     Status: Abnormal   Collection Time: 03/19/21  5:07 PM   Specimen: Urine, Random  Result  Value Ref Range Status   Specimen Description   Final    URINE, RANDOM Performed at [facility], [address], [place], [place] [address]    Special Requests   Final    NONE Performed at [facility], [address] [place], [place] [address]    Culture (A)  Final    <10,000 COLONIES/mL INSIGNIFICANT GROWTH Performed at [facility], [address], [place], [place] [address]    Report Status 03/20/2021 FINAL  Final     Labs: BNP (last 3 results) Recent Labs    03/19/21 1538  BNP 129.1*   Basic Metabolic Panel: Recent Labs  Lab 03/19/21 1614 03/19/21 2023 03/20/21 0352  NA 127* 132* 133*  K 3.2* 3.2* 3.7  CL 83* 89* 93*  CO2 29 29 30   GLUCOSE 121* 144* 138*  BUN 9 11 12   CREATININE 0.78 0.87 0.71  CALCIUM 8.5* 7.7* 7.5*   Liver Function Tests: Recent Labs  Lab 03/19/21 1614  AST 54*  ALT 25  ALKPHOS 51  BILITOT 0.3  PROT 7.8  ALBUMIN 3.4*   No results for input(s): LIPASE, AMYLASE in the last 168 hours. No results for input(s): AMMONIA in the last 168 hours. CBC: Recent Labs  Lab 03/19/21 1614 03/20/21 0352  WBC 6.4 7.1  NEUTROABS 5.4  --   HGB 14.6 13.5  HCT 44.5 42.5  MCV 98.5 100.2*  PLT 225 184   Cardiac Enzymes: No  results for input(s): CKTOTAL, CKMB, CKMBINDEX, TROPONINI in the last 168 hours. BNP: Invalid input(s): POCBNP CBG: No results for input(s): GLUCAP in the last 168 hours. D-Dimer No results for input(s): DDIMER in the last 72 hours. Hgb A1c No results for input(s): HGBA1C in the last 72 hours. Lipid Profile No results for input(s): CHOL, HDL, LDLCALC, TRIG, CHOLHDL, LDLDIRECT in the last 72 hours. Thyroid function studies No results for input(s): TSH, T4TOTAL, T3FREE, THYROIDAB in the last 72 hours.  Invalid input(s): FREET3 Anemia work up No results for input(s): VITAMINB12, FOLATE, FERRITIN, TIBC, IRON, RETICCTPCT in the last 72 hours. Urinalysis    Component Value Date/Time   COLORURINE YELLOW 03/19/2021 1707   APPEARANCEUR CLEAR 03/19/2021 1707   LABSPEC 1.016 03/19/2021 1707   PHURINE 6.0 03/19/2021 1707   GLUCOSEU NEGATIVE 03/19/2021 1707   HGBUR NEGATIVE 03/19/2021 1707   BILIRUBINUR NEGATIVE 03/19/2021 1707   KETONESUR 20 (A) 03/19/2021 1707   PROTEINUR 100 (A) 03/19/2021 1707   UROBILINOGEN 1.0 12/25/2009 1840   NITRITE NEGATIVE 03/19/2021 1707   LEUKOCYTESUR NEGATIVE 03/19/2021 1707   Sepsis Labs Invalid input(s): PROCALCITONIN,  WBC,  LACTICIDVEN Microbiology Recent Results (from the past 240 hour(s))  Blood culture (routine x 2)     Status: None (Preliminary result)   Collection Time: 03/19/21  4:04 PM   Specimen: BLOOD  Result Value Ref Range Status   Specimen Description   Final    BLOOD RIGHT ARM Performed at [facility], [address], [place], [place]    Special Requests   Final    BOTTLES DRAWN AEROBIC AND ANAEROBIC Blood Culture adequate volume Performed at [facility], [address] [place], [place]    Culture   Final    NO GROWTH 3 DAYS Performed at [facility], [address], [place], [address] [place]    Report Status PENDING  Incomplete  Blood culture (routine x 2)      Status:  None (Preliminary result)   Collection Time: 03/19/21  4:14 PM   Specimen: BLOOD  Result Value Ref Range Status   Specimen Description   Final    BLOOD RIGHT ANTECUBITAL Performed at [facility], [address] [place], [place] [address]    Special Requests   Final    BOTTLES DRAWN AEROBIC AND ANAEROBIC Blood Culture adequate volume Performed at [facility], [address] [place], [place] [address]    Culture   Final    NO GROWTH 3 DAYS Performed at [facility], [address] [place], [place] [address]    Report Status PENDING  Incomplete  Resp Panel by RT-PCR (Flu A&B, Covid) Nasopharyngeal Swab     Status: Abnormal   Collection Time: 03/19/21  4:18 PM   Specimen: Nasopharyngeal Swab; Nasopharyngeal(NP) swabs in vial transport medium  Result Value Ref Range Status   SARS Coronavirus 2 by RT PCR NEGATIVE NEGATIVE Final    Comment: (NOTE) SARS-CoV-2 target nucleic acids are NOT DETECTED.  The SARS-CoV-2 RNA is generally detectable in upper respiratory specimens during the acute phase of infection. The lowest concentration of SARS-CoV-2 viral copies this assay can detect is 138 copies/mL. A negative result does not preclude SARS-Cov-2 infection and should not be used as the sole basis for treatment or other patient management decisions. A negative result may occur with  improper specimen collection/handling, submission of specimen other than nasopharyngeal swab, presence of viral mutation(s) within the areas targeted by this assay, and inadequate number of viral copies(<138 copies/mL). A negative result must be combined with clinical observations, patient history, and epidemiological information. The expected result is Negative.  Fact Sheet for Patients:  BloggerCourse.comhttps://www.fda.gov/media/152166/download  Fact Sheet for Healthcare Providers:  SeriousBroker.ithttps://www.fda.gov/media/152162/download  This test is no t yet approved or  cleared by the [place] FDA and  has been authorized for detection and/or diagnosis of SARS-CoV-2 by FDA under an Emergency Use Authorization (EUA). This EUA will remain  in effect (meaning this test can be used) for the duration of the COVID-19 declaration under Section 564(b)(1) of the Act, 21 U.S.C.section 360bbb-3(b)(1), unless the authorization is terminated  or revoked sooner.       Influenza A by PCR POSITIVE (A) NEGATIVE Final   Influenza B by PCR NEGATIVE NEGATIVE Final    Comment: (NOTE) The Xpert Xpress SARS-CoV-2/FLU/RSV plus assay is intended as an aid in the diagnosis of influenza from Nasopharyngeal swab specimens and should not be used as a sole basis for treatment. Nasal washings and aspirates are unacceptable for Xpert Xpress SARS-CoV-2/FLU/RSV testing.  Fact Sheet for Patients: BloggerCourse.comhttps://www.fda.gov/media/152166/download  Fact Sheet for Healthcare Providers: SeriousBroker.ithttps://www.fda.gov/media/152162/download  This test is not yet approved or cleared by the [place] FDA and has been authorized for detection and/or diagnosis of SARS-CoV-2 by FDA under an Emergency Use Authorization (EUA). This EUA will remain in effect (meaning this test can be used) for the duration of the COVID-19 declaration under Section 564(b)(1) of the Act, 21 U.S.C. section 360bbb-3(b)(1), unless the authorization is terminated or revoked.  Performed at [facility], [address] [place], [place] [address]   Urine culture     Status: Abnormal   Collection Time: 03/19/21  5:07 PM   Specimen: Urine, Random  Result Value Ref Range Status   Specimen Description   Final    URINE, RANDOM Performed at [facility], [address] [place], [place] [address]    Special Requests  Final    NONE Performed at [facility], [address], [place], [place] [address]    Culture (A)  Final    <10,000 COLONIES/mL INSIGNIFICANT  GROWTH Performed at [facility], [address], [place], [place] [address]    Report Status 03/20/2021 FINAL  Final    Please note: You were cared for by a hospitalist during your hospital stay. Once you are discharged, your primary care physician will handle any further medical issues. Please note that NO REFILLS for any discharge medications will be authorized once you are discharged, as it is imperative that you return to your primary care physician (or establish a relationship with a primary care physician if you do not have one) for your post hospital discharge needs so that they can reassess your need for medications and monitor your lab values.    Time coordinating discharge: 40 minutes  SIGNED:   Burnadette Pop, MD  [facility] 03/22/2021, 11:34 AM Pager [phone]  If 7PM-7AM, please contact night-coverage [email or web address] Password [email or web address]

## 2021-03-22 NOTE — Progress Notes (Addendum)
Patient discharged home, discharge instructions given and explained to patient, she verbalized understanding, No wound noted, in no distress. Patient waiting on family for  transportation

## 2021-03-22 NOTE — TOC Progression Note (Addendum)
Transition of Care [facility]) - Progression Note    Patient Details  Name: Candice Cooper MRN: [id number] Date of Birth: [birth year]  Transition of Care [facility]) CM/SW Contact  Darleene Cleaver, [place] Phone Number: 03/22/2021, 10:10 AM  Clinical Narrative:     CSW was informed that patient needs a PCP.  CSW contacted [facility] and they were able to schedule an appointment for patient on Wednesday April 13, 2021 at 2:30pm.  Information put on patient's AVS.    Expected Discharge Plan and Services  Patient discharging back home.                            Social Determinants of Health (SDOH) Interventions    Readmission Risk Interventions No flowsheet data found.

## 2021-03-22 NOTE — TOC Transition Note (Signed)
Transition of Care [facility]) - CM/SW Discharge Note   Patient Details  Name: Candice Cooper MRN: [id number] Date of Birth: 1961/01/01  Transition of Care [facility]) CM/SW Contact:  Darleene Cleaver, LCSW Phone Number: 03/22/2021, 2:30 PM   Clinical Narrative:     Patient has an appointment set up, CSW signing off, no other anticipated needs.         Patient Goals and CMS Choice        Discharge Placement                       Discharge Plan and Services                                     Social Determinants of Health (SDOH) Interventions     Readmission Risk Interventions No flowsheet data found.

## 2021-03-22 NOTE — Plan of Care (Signed)
  Problem: Activity: Goal: Risk for activity intolerance will decrease Outcome: Completed/Met

## 2021-03-24 LAB — CULTURE, BLOOD (ROUTINE X 2)
Culture: NO GROWTH
Culture: NO GROWTH
Special Requests: ADEQUATE
Special Requests: ADEQUATE

## 2021-04-13 ENCOUNTER — Inpatient Hospital Stay: Payer: Medicaid Other | Admitting: Physician Assistant

## 2021-04-13 NOTE — Progress Notes (Deleted)
Patient ID: Candice Cooper, female   DOB: 1961/01/15, 60 y.o.   MRN: [id number]   After hospitalization 5/7-5/08/2021 for influenza complicated by COPD.  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 She was also complaining of headache, back pain, nausea, vomiting. On presentation she was febrile, tachycardic and hypoxic even on 4 L of oxygen onand had to be put on 5 L. Found to have elevated lactate on presentation, low sodium, low potassium. Influenza a was positive. Started on IV steroids, bronchodilators, Tamiflu with much improvement. Plan for discharge tomorrow to home with prednisone.  Following  problems were addressed during her hospitalization :   Acute on chronic hypoxic respiratory failure: Secondary to COPD exacerbation from flu. She was hypoxic, wheezing on presentation. She is on 4 L of oxygen at home at baseline. She feels much better today. Steroids changed to oral, she does not have any wheezes today.  Severe Sepsis secondary to influenza A:Presented with fever, tachycardia, tachypnea, lactic acidosis was present on admission. IV fluids discontinued.  On Tamiflu  Hyponatremia: Improved with IV fluids  Hypokalemia:Supplemented and corrected  Hypertension:Currently blood pressure stable.  Continue home medications  Tobacco use: Continues to smoke 2 cigarettes a day. Counseled for cessation.   Constipation: Continue bowel regimen  Debility/deconditioning: PT consulted on follow-up recommended  Headache: Given few doses of migraine cocktail.  Resolved   Discharge Diagnoses:  Principal Problem:   Acute on chronic respiratory failure with hypoxia (HCC) Active Problems:   Influenza A   Hyponatremia   Hypokalemia   Tobacco use   HTN (hypertension)

## 2022-03-27 IMAGING — CR DG CHEST 2V
2 series · 2 of 2 positions shown · non-contrast
Comparison: 05/18/2020

CLINICAL DATA: Choking episode at 1 p.m., cough, possible impacted
food bolus

EXAM:
CHEST - 2 VIEW

[w chest pa]
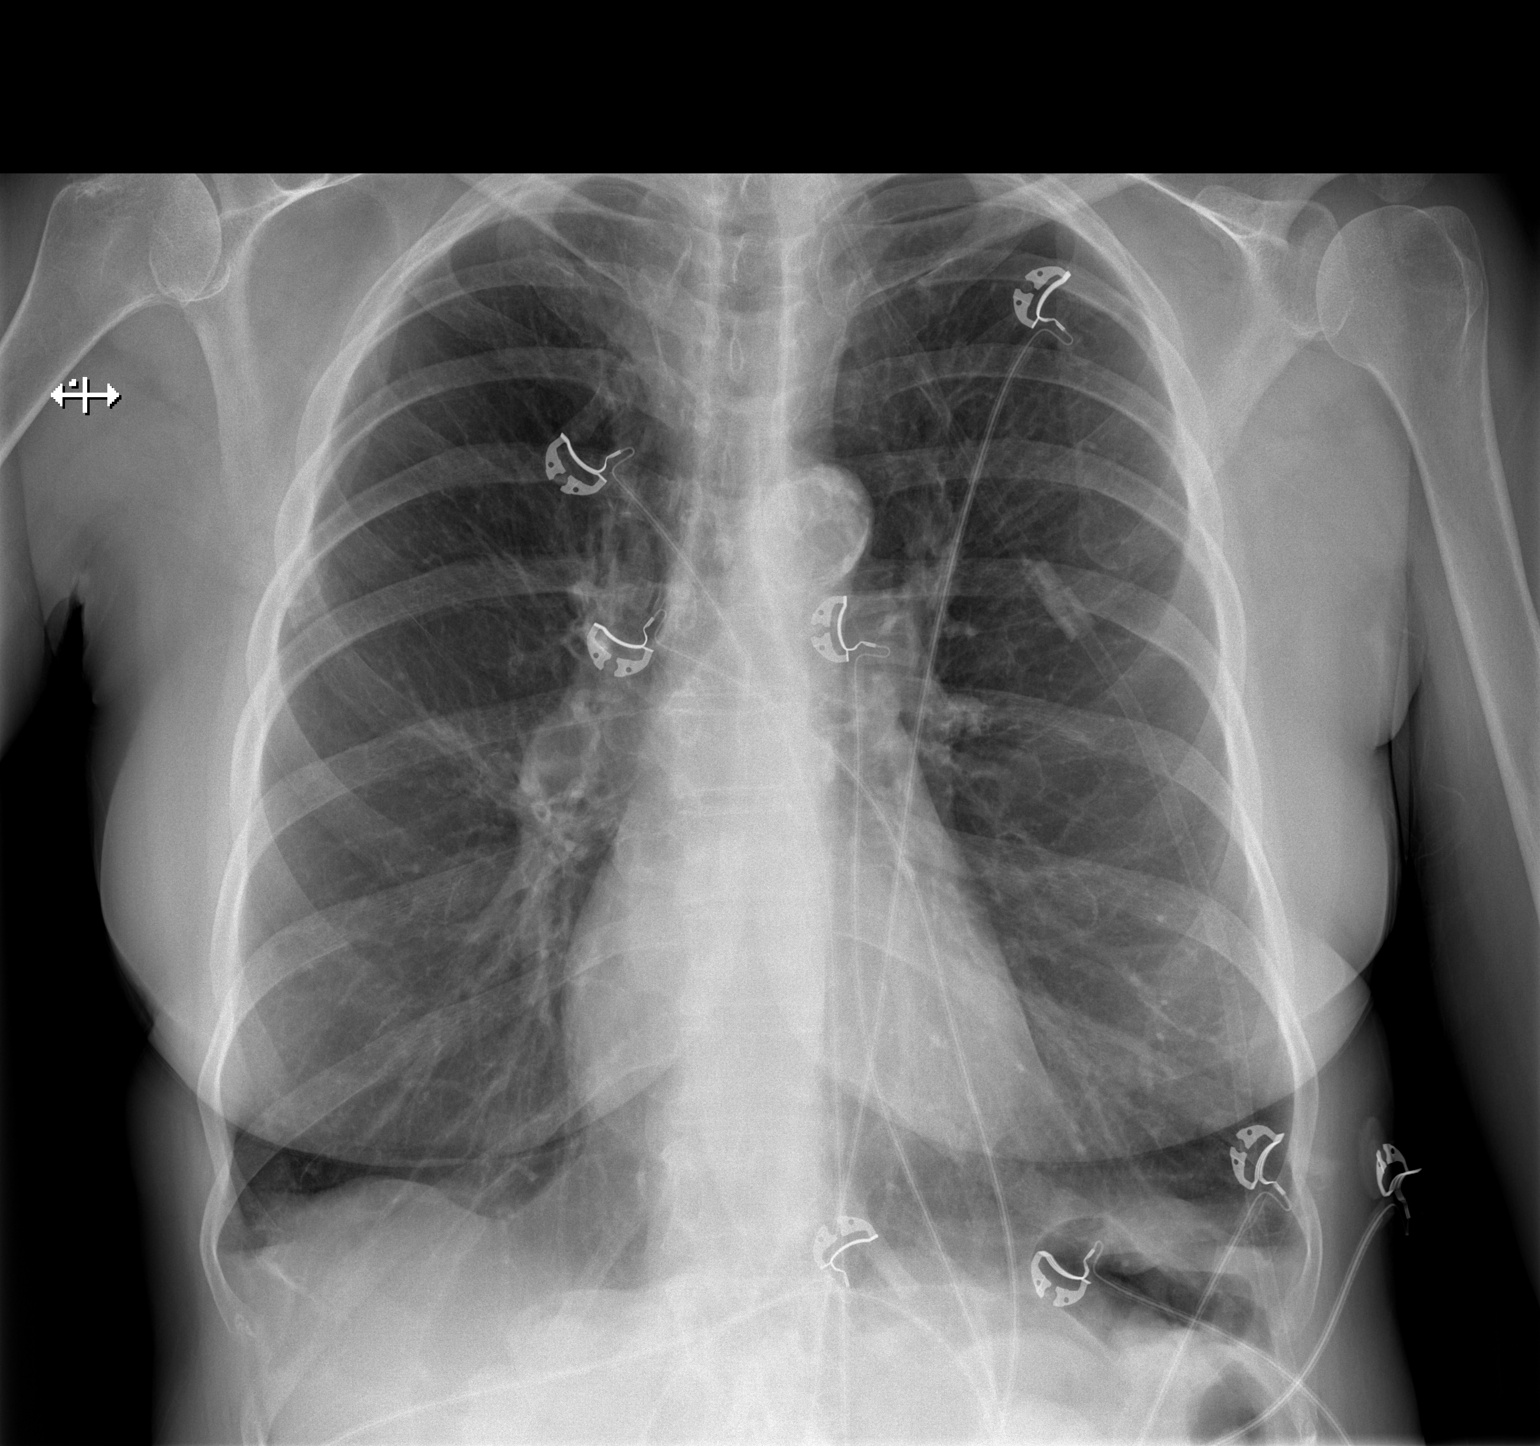

[w chest lat]
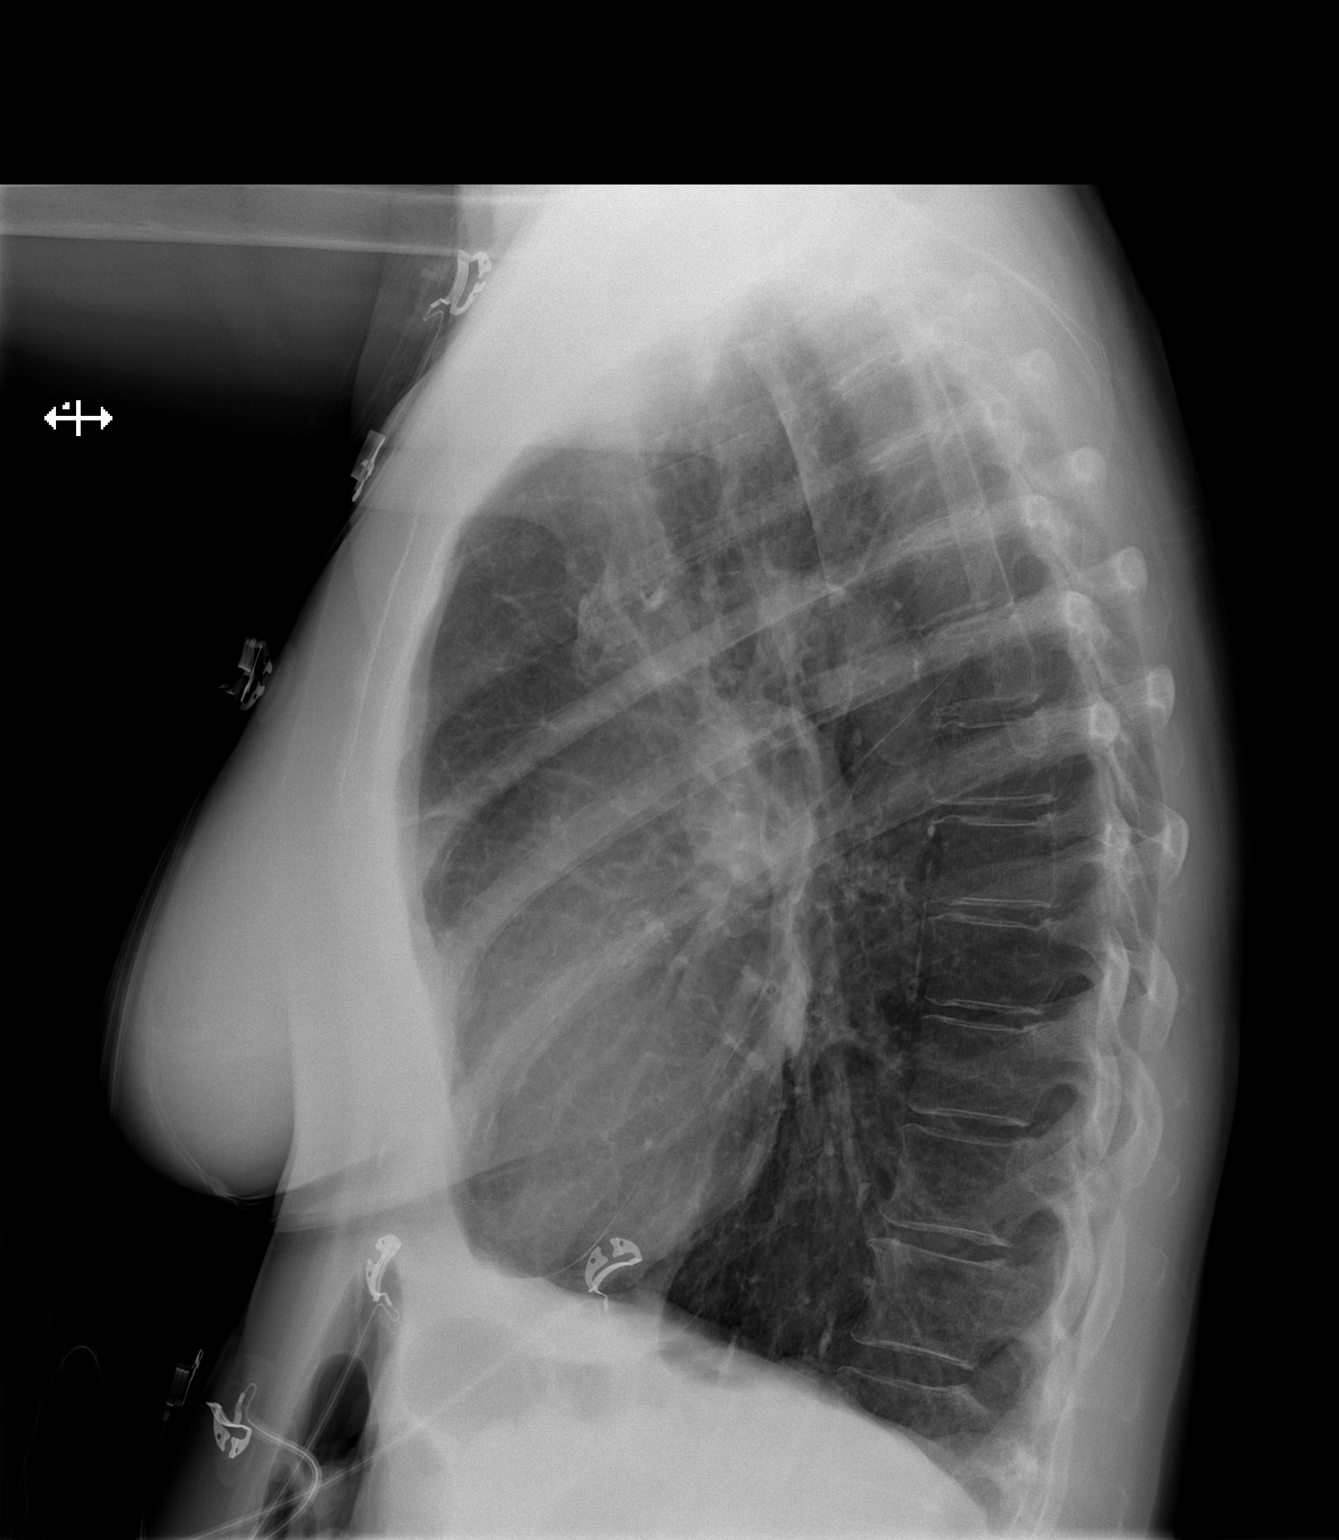

[2 of 2 positions shown; findings below may reference images not displayed]

FINDINGS: Frontal and lateral views of the chest demonstrate an unremarkable
cardiac silhouette. No airspace disease, effusion, or pneumothorax.
Lungs are mildly hyperinflated. No acute bony abnormalities. No
radiopaque foreign body.
IMPRESSION: 1. Mild hyperinflation. No acute airspace disease. No radiopaque
foreign body.

## 2022-09-30 IMAGING — DX DG CHEST 1V PORT
2 series · 2 of 2 positions shown · non-contrast
Comparison: September 13, 2020

CLINICAL DATA: Shortness of breath.

EXAM:
PORTABLE CHEST 1 VIEW

[chest ap (1 of 2)]
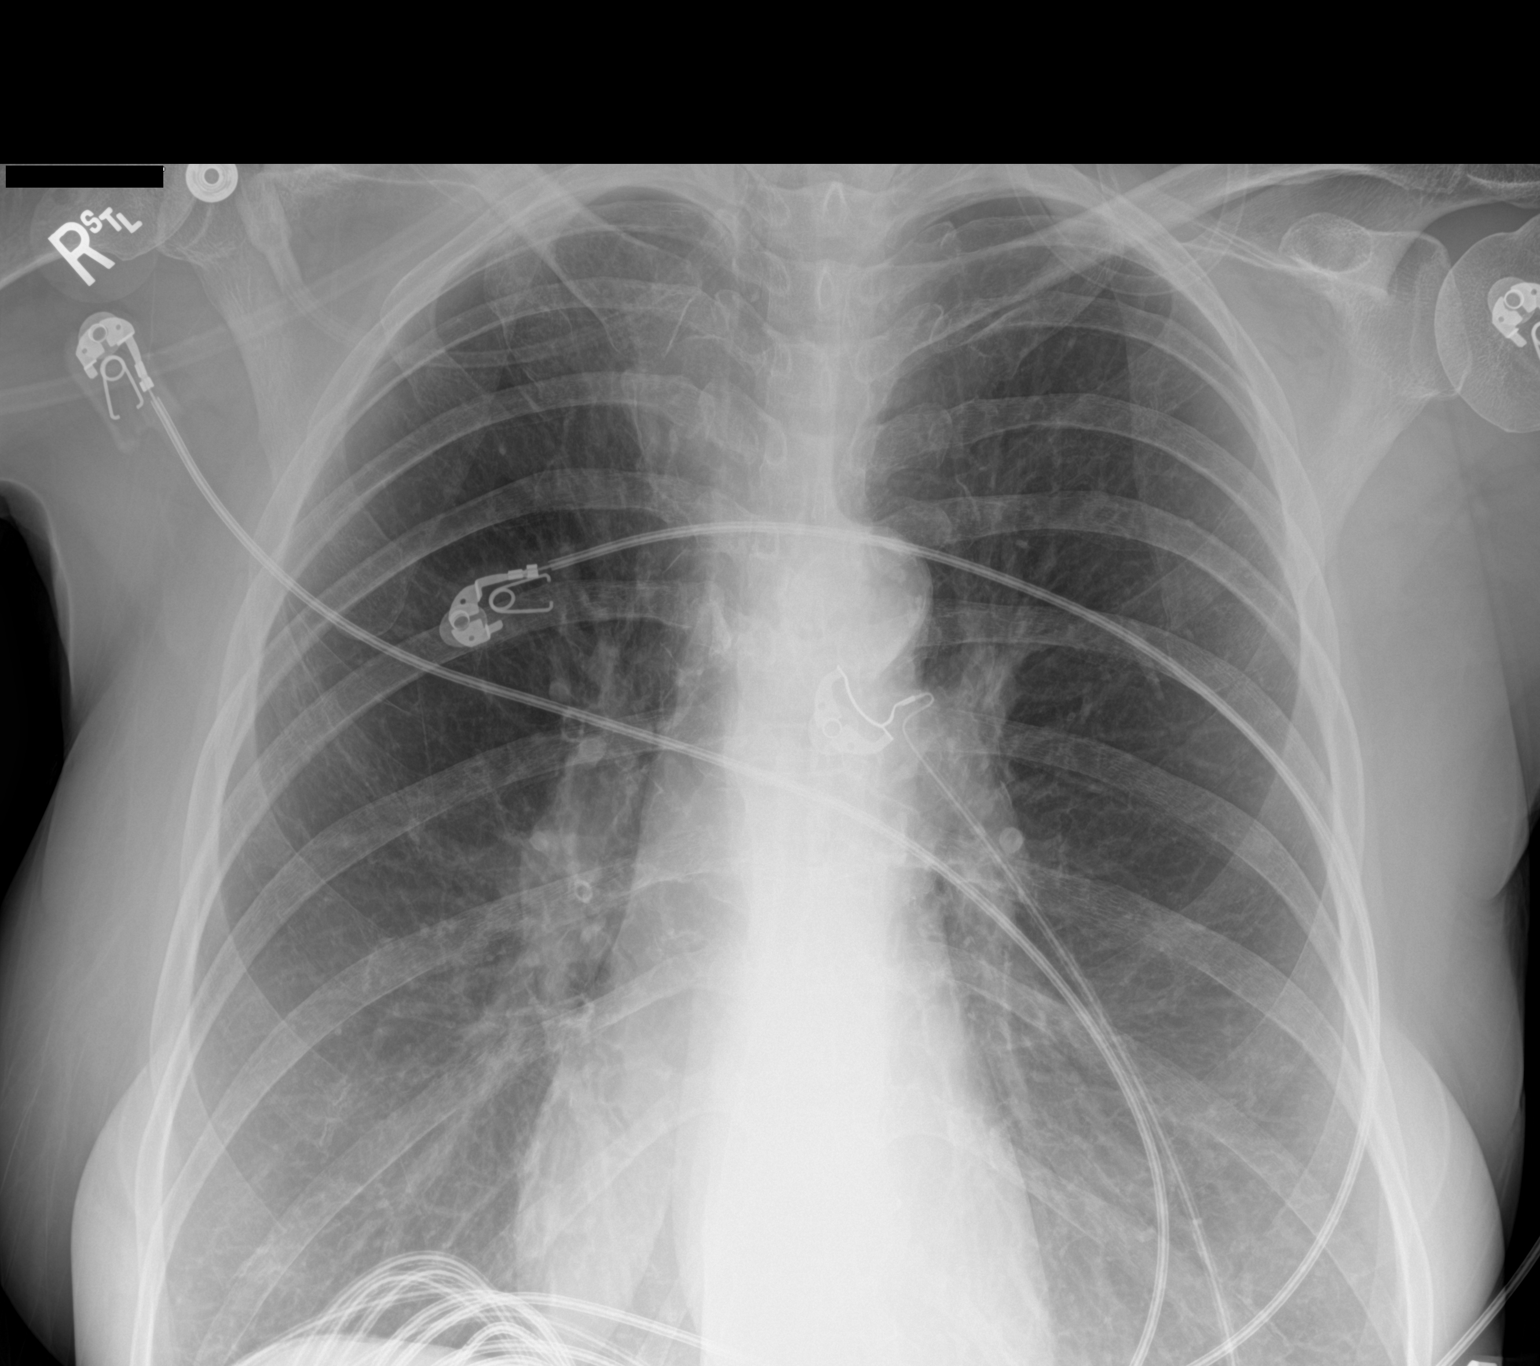

[chest ap (2 of 2)]
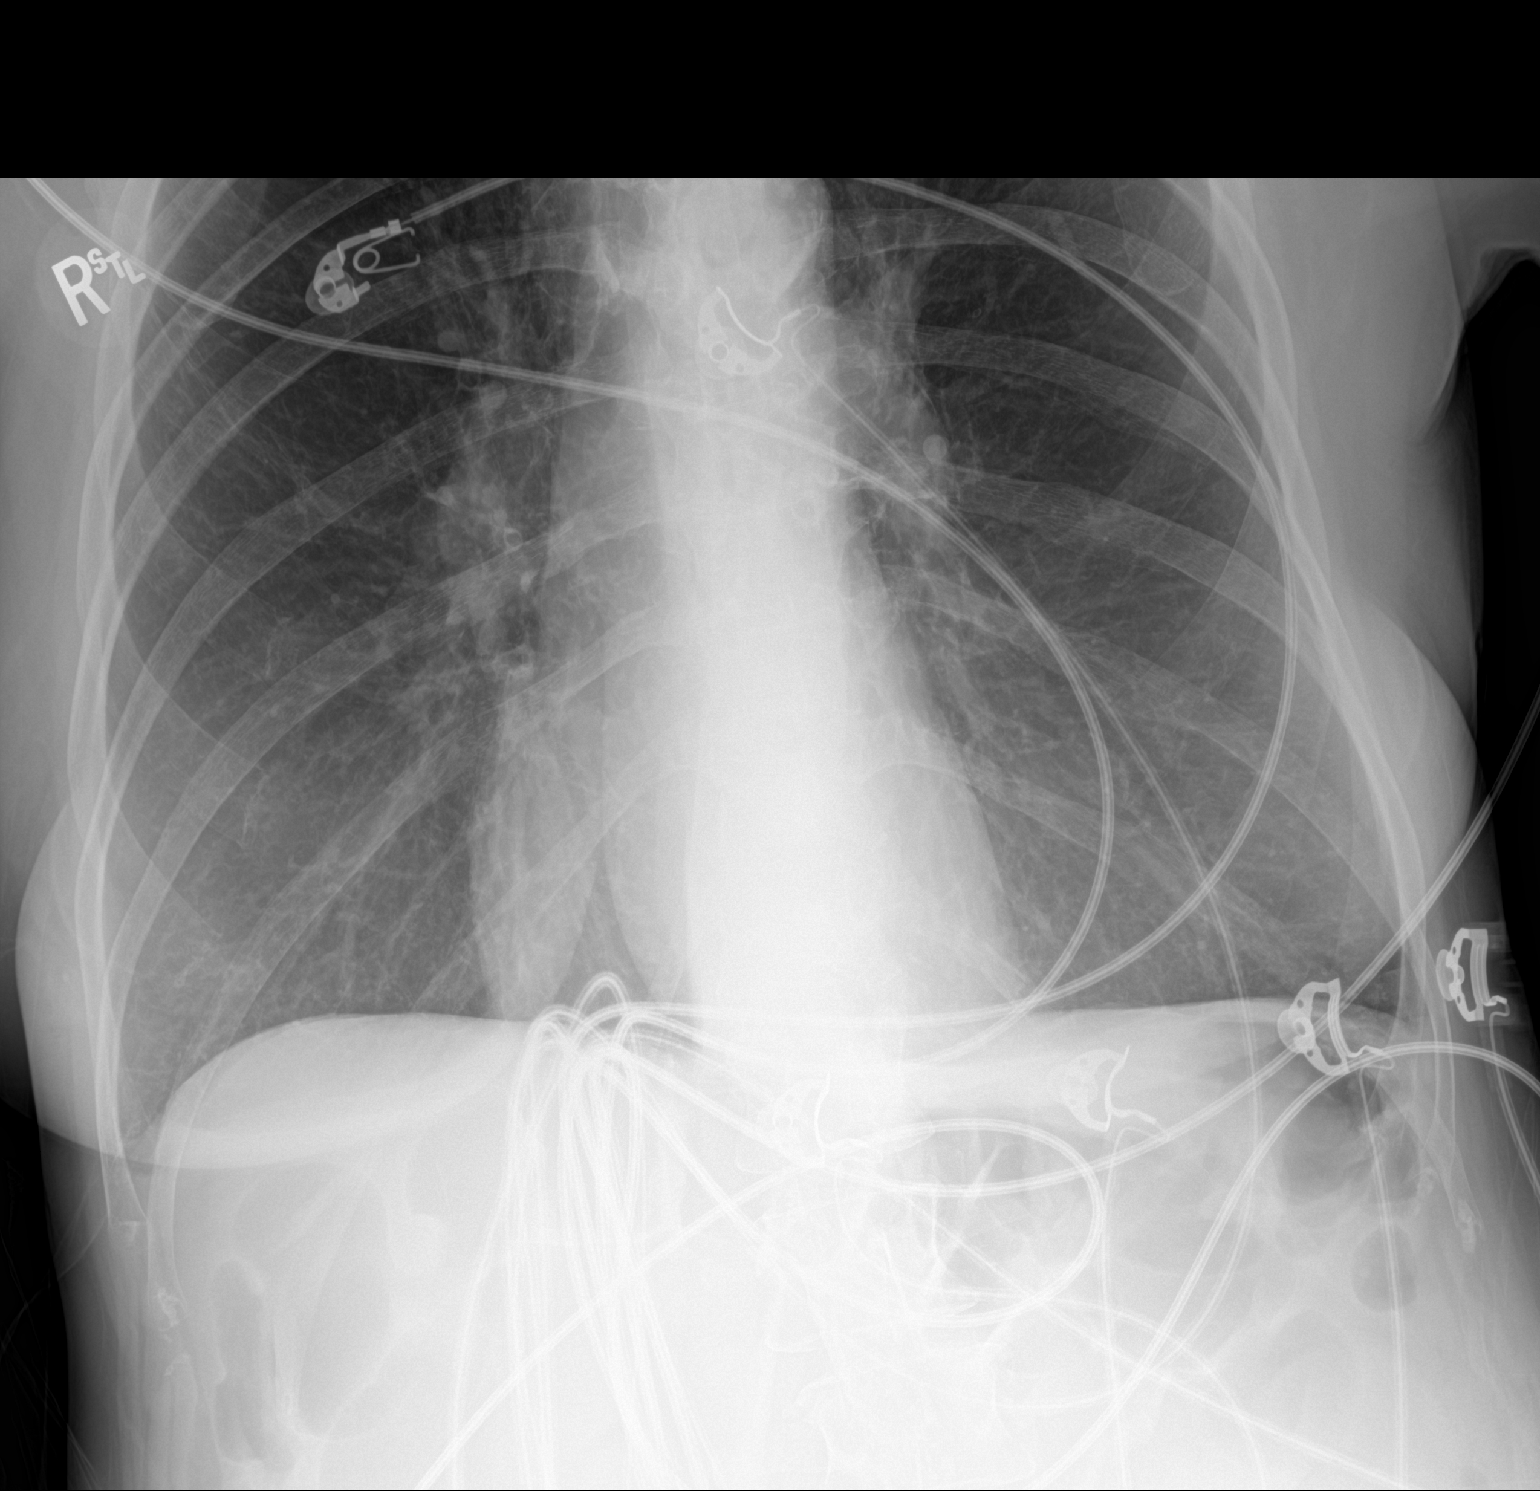

[2 of 2 positions shown; findings below may reference images not displayed]

FINDINGS: The lungs are hyperinflated. There is no evidence of acute
infiltrate, pleural effusion or pneumothorax. The heart size and
mediastinal contours are within normal limits. Moderate to marked
severity calcification of the aortic arch is noted. The visualized
skeletal structures are unremarkable.
IMPRESSION: No active cardiopulmonary disease.
